# Patient Record
Sex: Male | Born: 1950 | Race: White | Hispanic: No | Marital: Married | Smoking: Never smoker
Health system: Southern US, Community
[De-identification: ages and names within clinical notes are randomized; demographics above are authoritative.]

## PROBLEM LIST (undated history)

## (undated) DIAGNOSIS — R55 Syncope and collapse: Secondary | ICD-10-CM

## (undated) DIAGNOSIS — Z8639 Personal history of other endocrine, nutritional and metabolic disease: Secondary | ICD-10-CM

## (undated) DIAGNOSIS — N200 Calculus of kidney: Secondary | ICD-10-CM

## (undated) DIAGNOSIS — I10 Essential (primary) hypertension: Secondary | ICD-10-CM

## (undated) HISTORY — DX: Essential (primary) hypertension: I10

## (undated) HISTORY — DX: Syncope and collapse: R55

## (undated) HISTORY — PX: LITHOTRIPSY: SUR834

## (undated) HISTORY — DX: Personal history of other endocrine, nutritional and metabolic disease: Z86.39

---

## 2010-01-23 ENCOUNTER — Emergency Department (HOSPITAL_BASED_OUTPATIENT_CLINIC_OR_DEPARTMENT_OTHER): Admission: EM | Admit: 2010-01-23 | Discharge: 2010-01-23 | Payer: Self-pay | Admitting: Emergency Medicine

## 2010-01-23 ENCOUNTER — Ambulatory Visit: Payer: Self-pay | Admitting: Radiology

## 2013-08-01 ENCOUNTER — Ambulatory Visit (INDEPENDENT_AMBULATORY_CARE_PROVIDER_SITE_OTHER): Payer: BC Managed Care – PPO | Admitting: Cardiovascular Disease

## 2013-08-01 ENCOUNTER — Encounter: Payer: Self-pay | Admitting: Cardiovascular Disease

## 2013-08-01 VITALS — BP 150/82 | HR 66 | Ht 71.0 in | Wt 197.0 lb

## 2013-08-01 DIAGNOSIS — E785 Hyperlipidemia, unspecified: Secondary | ICD-10-CM

## 2013-08-01 DIAGNOSIS — Z9189 Other specified personal risk factors, not elsewhere classified: Secondary | ICD-10-CM

## 2013-08-01 DIAGNOSIS — Z87898 Personal history of other specified conditions: Secondary | ICD-10-CM | POA: Insufficient documentation

## 2013-08-01 DIAGNOSIS — I10 Essential (primary) hypertension: Secondary | ICD-10-CM

## 2013-08-01 DIAGNOSIS — R3911 Hesitancy of micturition: Secondary | ICD-10-CM

## 2013-08-01 DIAGNOSIS — Z79899 Other long term (current) drug therapy: Secondary | ICD-10-CM

## 2013-08-01 LAB — COMPLETE METABOLIC PANEL WITH GFR
AST: 25 U/L (ref 0–37)
Chloride: 104 mEq/L (ref 96–112)
GFR, Est African American: >89 mL/min
GFR, Est Non African American: 84 mL/min
Glucose, Bld: 86 mg/dL (ref 70–99)
Sodium: 139 mEq/L (ref 135–145)
Total Protein: 7.3 g/dL (ref 6.0–8.3)

## 2013-08-01 LAB — CBC
HCT: 43 % (ref 39.0–52.0)
Hemoglobin: 15 g/dL (ref 13.0–17.0)
Platelets: 219 10*3/uL (ref 150–400)
RDW: 13.5 % (ref 11.5–15.5)

## 2013-08-01 LAB — LIPID PANEL
HDL: 47 mg/dL (ref >39)
Triglycerides: 78 mg/dL (ref <150)
VLDL: 16 mg/dL (ref 0–40)

## 2013-08-01 LAB — TSH: TSH: 1.534 u[IU]/mL (ref 0.350–4.500)

## 2013-08-01 MED ORDER — NIACIN ER (ANTIHYPERLIPIDEMIC) 500 MG PO TBCR: 500.0000 mg | EXTENDED_RELEASE_TABLET | Freq: Every day | ORAL | Status: DC

## 2013-08-01 MED ORDER — SIMVASTATIN 40 MG PO TABS: 40.0000 mg | ORAL_TABLET | Freq: Every day | ORAL | Status: DC

## 2013-08-01 MED ORDER — NEBIVOLOL HCL 5 MG PO TABS: 5.0000 mg | ORAL_TABLET | Freq: Every day | ORAL | Status: DC

## 2013-08-01 NOTE — Assessment & Plan Note (Signed)
He had a positive tilt table test approximately 20 years ago and has been treated medically with no recurrence.

## 2013-08-01 NOTE — Progress Notes (Signed)
     08/01/2013 Dustin Meager Jr.   Aug 20, 1951  [id number]  Primary Physician Dustin Maker, MD Primary Cardiologist: Runell Gess MD Dustin Woodard   HPI:  Dustin Woodard is a very pleasant 62 year old married Caucasian male father of 3, gram-positive 3 children referred by Dr. Hoy Woodard to establish in our cardiovascular practice. His primary care physician is Dr. Gerlene Woodard or in [facility]. 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 History of cardiovascular psychopathology positive for hypertension and hyperlipidemia otherwise is benign. He drinks socially and does not smoke. He denies chest pain or shortness of breath.   No current outpatient prescriptions on file.   No current facility-administered medications for this visit.    No Known Allergies  History   Social History  . Marital Status: Married    Spouse Name: N/A    Number of Children: N/A  . Years of Education: N/A   Occupational History  . Not on file.   Social History Main Topics  . Smoking status: Never Smoker   . Smokeless tobacco: Not on file  . Alcohol Use: Yes     Comment: occ wine and beer  . Drug Use: Not on file  . Sexual Activity: Not on file   Other Topics Concern  . Not on file   Social History Narrative  . No narrative on file     Review of Systems: General: negative for chills, fever, night sweats or weight changes.  Cardiovascular: negative for chest pain, dyspnea on exertion, edema, orthopnea, palpitations, paroxysmal nocturnal dyspnea or shortness of breath Dermatological: negative for rash Respiratory: negative for cough or wheezing Urologic: negative for hematuria Abdominal: negative for nausea, vomiting, diarrhea, bright red blood per rectum, melena, or hematemesis Neurologic: negative for visual changes, syncope, or dizziness All other systems reviewed and are otherwise negative except as noted above.    Blood pressure 150/82,  pulse 66, height 5\' 11"  (1.803 m), weight 197 lb (89.359 kg).  General appearance: alert and no distress Neck: no adenopathy, no carotid bruit, no JVD, supple, symmetrical, trachea midline and thyroid not enlarged, symmetric, no tenderness/mass/nodules Lungs: clear to auscultation bilaterally Heart: regular rate and rhythm, S1, S2 normal, no murmur, click, rub or gallop Abdomen: soft, non-tender; bowel sounds normal; no masses,  no organomegaly Extremities: extremities normal, atraumatic, no cyanosis or edema Pulses: 2+ and symmetric  EKG normal sinus rhythm at 65 without ST or T wave changes  ASSESSMENT AND PLAN:   Essential hypertension Well-controlled on current medications  Hyperlipidemia On statin therapy followed by his PCP. We will recheck a lipid and liver profile  History of syncope He had a positive tilt table test approximately 20 years ago and has been treated medically with no recurrence.      Runell Gess MD FACP,FACC,FAHA, [facility] 08/01/2013 11:35 AM

## 2013-08-01 NOTE — Assessment & Plan Note (Signed)
On statin therapy followed by his PCP. We will recheck a lipid and liver profile 

## 2013-08-01 NOTE — Patient Instructions (Signed)
Your physician wants you to follow-up in: 1 year with Dr Allyson Sabal. You will receive a reminder letter in the mail two months in advance. If you don't receive a letter, please call our office to schedule the follow-up appointment.   Blood work to be done today.

## 2013-08-01 NOTE — Assessment & Plan Note (Signed)
Well-controlled on current medications 

## 2013-08-02 ENCOUNTER — Encounter: Payer: Self-pay | Admitting: Cardiovascular Disease

## 2013-08-02 LAB — PSA: PSA: 1.93 ng/mL (ref <=4.00)

## 2013-08-09 ENCOUNTER — Encounter: Payer: Self-pay | Admitting: *Deleted

## 2014-04-08 ENCOUNTER — Telehealth: Payer: Self-pay | Admitting: Cardiovascular Disease

## 2014-04-08 NOTE — Telephone Encounter (Signed)
Mr.Marcello is needing a lab order sent to him before his appointment on 08/12/14  Thanks

## 2014-04-08 NOTE — Telephone Encounter (Signed)
Spoke with pt, he will come fasting to his follow up appt and have labs drawn after being seen

## 2014-07-29 ENCOUNTER — Telehealth: Payer: Self-pay | Admitting: Cardiovascular Disease

## 2014-07-29 MED ORDER — SIMVASTATIN 40 MG PO TABS: 40.0000 mg | ORAL_TABLET | Freq: Every day | ORAL | Status: DC

## 2014-07-29 MED ORDER — NIACIN ER (ANTIHYPERLIPIDEMIC) 500 MG PO TBCR: 500.0000 mg | EXTENDED_RELEASE_TABLET | Freq: Every day | ORAL | Status: DC

## 2014-07-29 NOTE — Telephone Encounter (Signed)
Pt called in needing new prescriptions for his Niacin 500mg  and his Simvastatin 40mg  call in to the [facility] on [address] in [place]. Please call  Thanks

## 2014-07-29 NOTE — Telephone Encounter (Signed)
Rx was sent to pharmacy electronically. OV 12/4 

## 2014-08-02 ENCOUNTER — Encounter: Payer: Self-pay | Admitting: Cardiovascular Disease

## 2014-08-02 ENCOUNTER — Ambulatory Visit (INDEPENDENT_AMBULATORY_CARE_PROVIDER_SITE_OTHER): Payer: BC Managed Care – PPO | Admitting: Cardiovascular Disease

## 2014-08-02 VITALS — BP 126/80 | HR 59 | Ht 71.0 in | Wt 197.6 lb

## 2014-08-02 DIAGNOSIS — Z79899 Other long term (current) drug therapy: Secondary | ICD-10-CM

## 2014-08-02 DIAGNOSIS — E785 Hyperlipidemia, unspecified: Secondary | ICD-10-CM

## 2014-08-02 DIAGNOSIS — I10 Essential (primary) hypertension: Secondary | ICD-10-CM

## 2014-08-02 DIAGNOSIS — E782 Mixed hyperlipidemia: Secondary | ICD-10-CM

## 2014-08-02 LAB — LIPID PANEL
CHOLESTEROL: 156 mg/dL (ref 0–200)
HDL: 49 mg/dL (ref >39)
LDL Cholesterol: 90 mg/dL (ref 0–99)
TRIGLYCERIDES: 87 mg/dL (ref <150)
Total CHOL/HDL Ratio: 3.2 Ratio
VLDL: 17 mg/dL (ref 0–40)

## 2014-08-02 LAB — HEPATIC FUNCTION PANEL
ALT: 21 U/L (ref 0–53)
AST: 22 U/L (ref 0–37)
Albumin: 4.5 g/dL (ref 3.5–5.2)
Alkaline Phosphatase: 89 U/L (ref 39–117)
BILIRUBIN TOTAL: 0.8 mg/dL (ref 0.2–1.2)
Bilirubin, Direct: 0.2 mg/dL (ref 0.0–0.3)
Indirect Bilirubin: 0.6 mg/dL (ref 0.2–1.2)
Total Protein: 7 g/dL (ref 6.0–8.3)

## 2014-08-02 MED ORDER — NEBIVOLOL HCL 5 MG PO TABS: 5.0000 mg | ORAL_TABLET | Freq: Every day | ORAL | Status: DC

## 2014-08-02 NOTE — Patient Instructions (Signed)
Your physician wants you to follow-up in: 1 year. You will receive a reminder letter in the mail two months in advance. If you don't receive a letter, please call our office to schedule the follow-up appointment.  

## 2014-08-02 NOTE — Assessment & Plan Note (Signed)
History of hypertension with blood pressure measured today in the office in 150/82. He is on by systolic 5 mg a day. Continue current medications at current dosing

## 2014-08-02 NOTE — Assessment & Plan Note (Addendum)
History of hyperlipidemia on simvastatin 40 mg daily. We will check a lipid and liver profile

## 2014-08-02 NOTE — Progress Notes (Signed)
08/02/2014 Dustin MeagerJulian Adolph Jr.   [birth year]  [id number]  Primary Physician Dustin Woodard,Dustin L, MD Primary Cardiologist: Dustin GessJonathan J. Jane Broughton MD Dustin RenoFACP,FACC,FAHA, FSCAI   HPI:   [name] is a very pleasant 63 year old married Caucasian male father of 3, grandfather 3 grandchildren referred by Dr. Hoy Mornharlie Woodard to establish in our cardiovascular practice. His primary care physician is Dr. Royanne Footsichard Woodard in [place]. 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 History of cardiovascular psychopathology positive for hypertension and hyperlipidemia otherwise is benign. He drinks socially and does not smoke. He denies chest pain or shortness of breath. I last saw him one year ago. He denies chest pain or shortness of breath. He had brief episode of hematuria which is being worked up by a urologist. He did take a trip to [place] finishing since I saw him last.   Current Outpatient Prescriptions  Medication Sig Dispense Refill  . aspirin 81 MG tablet Take 81 mg by mouth daily.    . Multiple Vitamin (MULTIVITAMIN) capsule Take 1 capsule by mouth daily.    . nebivolol (BYSTOLIC) 5 MG tablet Take 1 tablet (5 mg total) by mouth daily. 90 tablet 3  . niacin (NIASPAN) 500 MG CR tablet Take 1 tablet (500 mg total) by mouth at bedtime. 90 tablet 0  . simvastatin (ZOCOR) 40 MG tablet Take 1 tablet (40 mg total) by mouth daily. 90 tablet 0   No current facility-administered medications for this visit.    No Known Allergies  History   Social History  . Marital Status: Married    Spouse Name: N/A    Number of Children: N/A  . Years of Education: N/A   Occupational History  . Not on file.   Social History Main Topics  . Smoking status: Never Smoker   . Smokeless tobacco: Not on file  . Alcohol Use: Yes     Comment: occ wine and beer  . Drug Use: Not on file  . Sexual Activity: Not on file   Other Topics Concern  . Not on file   Social History Narrative     Review of Systems: General: negative for chills, fever, night sweats or weight changes.  Cardiovascular: negative for chest pain, dyspnea on exertion, edema, orthopnea, palpitations, paroxysmal nocturnal dyspnea or shortness of breath Dermatological: negative for rash Respiratory: negative for cough or wheezing Urologic: negative for hematuria Abdominal: negative for nausea, vomiting, diarrhea, bright red blood per rectum, melena, or hematemesis Neurologic: negative for visual changes, syncope, or dizziness All other systems reviewed and are otherwise negative except as noted above.    Blood pressure 126/80, pulse 59, height 5\' 11"  (1.803 m), weight 197 lb 9.6 oz (89.631 kg).  General appearance: alert and no distress Neck: no adenopathy, no carotid bruit, no JVD, supple, symmetrical, trachea midline and thyroid not enlarged, symmetric, no tenderness/mass/nodules Lungs: clear to auscultation bilaterally Heart: regular rate and rhythm, S1, S2 normal, no murmur, click, rub or gallop Extremities: extremities normal, atraumatic, no cyanosis or edema  EKG sinus bradycardia of 59 with ST or T-wave changes. I personally reviewed this EKG  ASSESSMENT AND PLAN:   Essential hypertension History of hypertension with blood pressure measured today in the office in 150/82. He is on by systolic 5 mg a day. Continue current medications at current dosing  Hyperlipidemia History of hyperlipidemia on simvastatin 40 mg daily. We will check a lipid and liver profile      Dustin GessJonathan J. German Manke  MD FACP,FACC,FAHA, Franklyn LorFSCAI 08/02/2014 9:11 AM

## 2014-08-06 ENCOUNTER — Encounter: Payer: Self-pay | Admitting: *Deleted

## 2014-08-23 ENCOUNTER — Emergency Department (HOSPITAL_BASED_OUTPATIENT_CLINIC_OR_DEPARTMENT_OTHER): Payer: BC Managed Care – PPO

## 2014-08-23 ENCOUNTER — Emergency Department (HOSPITAL_BASED_OUTPATIENT_CLINIC_OR_DEPARTMENT_OTHER)
Admission: EM | Admit: 2014-08-23 | Discharge: 2014-08-23 | Disposition: A | Discharge status: Home / Self Care | Payer: BC Managed Care – PPO | Attending: Emergency Medicine | Admitting: Emergency Medicine

## 2014-08-23 ENCOUNTER — Encounter (HOSPITAL_BASED_OUTPATIENT_CLINIC_OR_DEPARTMENT_OTHER): Payer: Self-pay | Admitting: *Deleted

## 2014-08-23 DIAGNOSIS — Z79899 Other long term (current) drug therapy: Secondary | ICD-10-CM | POA: Diagnosis not present

## 2014-08-23 DIAGNOSIS — Z7982 Long term (current) use of aspirin: Secondary | ICD-10-CM | POA: Diagnosis not present

## 2014-08-23 DIAGNOSIS — I10 Essential (primary) hypertension: Secondary | ICD-10-CM | POA: Insufficient documentation

## 2014-08-23 DIAGNOSIS — Z9889 Other specified postprocedural states: Secondary | ICD-10-CM | POA: Insufficient documentation

## 2014-08-23 DIAGNOSIS — R1030 Lower abdominal pain, unspecified: Secondary | ICD-10-CM | POA: Diagnosis present

## 2014-08-23 DIAGNOSIS — R109 Unspecified abdominal pain: Secondary | ICD-10-CM

## 2014-08-23 DIAGNOSIS — E785 Hyperlipidemia, unspecified: Secondary | ICD-10-CM | POA: Insufficient documentation

## 2014-08-23 DIAGNOSIS — N201 Calculus of ureter: Secondary | ICD-10-CM | POA: Diagnosis not present

## 2014-08-23 HISTORY — DX: Calculus of kidney: N20.0

## 2014-08-23 LAB — URINE MICROSCOPIC-ADD ON

## 2014-08-23 LAB — CBC
HEMATOCRIT: 38.6 % — AB (ref 39.0–52.0)
Hemoglobin: 13.4 g/dL (ref 13.0–17.0)
MCH: 32.3 pg (ref 26.0–34.0)
MCHC: 34.7 g/dL (ref 30.0–36.0)
MCV: 93 fL (ref 78.0–100.0)
Platelets: 200 10*3/uL (ref 150–400)
RBC: 4.15 MIL/uL — ABNORMAL LOW (ref 4.22–5.81)
RDW: 11.7 % (ref 11.5–15.5)
WBC: 8.8 10*3/uL (ref 4.0–10.5)

## 2014-08-23 LAB — BASIC METABOLIC PANEL
Anion gap: 14 (ref 5–15)
BUN: 23 mg/dL (ref 6–23)
CHLORIDE: 99 meq/L (ref 96–112)
CO2: 24 mmol/L (ref 19–32)
Calcium: 9.7 mg/dL (ref 8.4–10.5)
Creatinine, Ser: 1.59 mg/dL — ABNORMAL HIGH (ref 0.50–1.35)
GFR, EST AFRICAN AMERICAN: 52 mL/min — AB (ref >90)
GFR, EST NON AFRICAN AMERICAN: 45 mL/min — AB (ref >90)
GLUCOSE: 106 mg/dL — AB (ref 70–99)
POTASSIUM: 4.2 mmol/L (ref 3.5–5.1)
Sodium: 137 mmol/L (ref 135–145)

## 2014-08-23 LAB — URINALYSIS, ROUTINE W REFLEX MICROSCOPIC
Bilirubin Urine: NEGATIVE
GLUCOSE, UA: NEGATIVE mg/dL
KETONES UR: 40 mg/dL — AB
Nitrite: NEGATIVE
PROTEIN: 30 mg/dL — AB
Specific Gravity, Urine: 1.023 (ref 1.005–1.030)
Urobilinogen, UA: 0.2 mg/dL (ref 0.0–1.0)
pH: 5.5 (ref 5.0–8.0)

## 2014-08-23 MED ORDER — FENTANYL CITRATE 0.05 MG/ML IJ SOLN
INTRAMUSCULAR | Status: AC
  Filled 2014-08-23: qty 2

## 2014-08-23 MED ORDER — FENTANYL CITRATE 0.05 MG/ML IJ SOLN
50.0000 ug | Freq: Once | INTRAMUSCULAR | Status: AC
  Administered 2014-08-23: 50 ug via INTRAVENOUS

## 2014-08-23 NOTE — Discharge Instructions (Signed)
Return to the ED with any concerns including fever/chills, vomiting, worsening pain that is not controlled by pain mediciations, decreased level of alertness/lethargy, or any other alarming symptoms

## 2014-08-23 NOTE — ED Notes (Signed)
MD at bedside. 

## 2014-08-23 NOTE — ED Provider Notes (Signed)
CSN: [id number]     Arrival date & time 08/23/14  1544 History   First MD Initiated Contact with Patient 08/23/14 1604     Chief Complaint  Patient presents with  . Flank Pain     (Consider location/radiation/quality/duration/timing/severity/associated sxs/prior Treatment) HPI  Pt presenting with c/o left flank pain.  Pt states that he had lithotripsy earlier in the week for large left kidney stone- since that time he has been passing small pieces of stone.  He has been taking oxycodone which has been mostly helping to relieve his pain. Today pain became worse.  No vomiting but has felt nauseated.  No  Fever/chills.  Has been seeing some intermittent blood in his urine as well, but no difficulty urinating.  There are no other associated systemic symptoms, there are no other alleviating or modifying factors.   Past Medical History  Diagnosis Date  . HTN (hypertension)   . H/O hyperlipidemia   . Syncope 1990s    positive tilt table 20 years ago  . Kidney stone    Past Surgical History  Procedure Laterality Date  . Lithotripsy     Family History  Problem Relation Age of Onset  . Heart disease Mother     diagnosed in her 9360's   History  Substance Use Topics  . Smoking status: Never Smoker   . Smokeless tobacco: Not on file  . Alcohol Use: Yes     Comment: occ wine and beer    Review of Systems  ROS reviewed and all otherwise negative except for mentioned in HPI    Allergies  Review of patient's allergies indicates no known allergies.  Home Medications   Prior to Admission medications   Medication Sig Start Date End Date Taking? Authorizing Provider  oxyCODONE-acetaminophen (PERCOCET/ROXICET) 5-325 MG per tablet Take by mouth every 4 (four) hours as needed for severe pain.   Yes Historical Provider, MD  tamsulosin (FLOMAX) 0.4 MG CAPS capsule Take 0.4 mg by mouth.   Yes Historical Provider, MD  aspirin 81 MG tablet Take 81 mg by mouth daily.    Historical Provider, MD   Multiple Vitamin (MULTIVITAMIN) capsule Take 1 capsule by mouth daily.    Historical Provider, MD  nebivolol (BYSTOLIC) 5 MG tablet Take 1 tablet (5 mg total) by mouth daily. 08/02/14   Runell GessJonathan J Berry, MD  niacin (NIASPAN) 500 MG CR tablet Take 1 tablet (500 mg total) by mouth at bedtime. 07/29/14   Runell GessJonathan J Berry, MD  simvastatin (ZOCOR) 40 MG tablet Take 1 tablet (40 mg total) by mouth daily. 07/29/14   Runell GessJonathan J Berry, MD   BP 156/78 mmHg  Pulse 82  Temp(Src) 98.6 F (37 C) (Oral)  Resp 16  Ht 5\' 11"  (1.803 m)  Wt 190 lb (86.183 kg)  BMI 26.51 kg/m2  SpO2 97%  Vitals reviewed Physical Exam  Physical Examination: General appearance - alert, well appearing, and in no distress Mental status - alert, oriented to person, place, and time Eyes - no conjunctival injection, no scleral icterus Chest - clear to auscultation, no wheezes, rales or rhonchi, symmetric air entry Heart - normal rate, regular rhythm, normal S1, S2, no murmurs, rubs, clicks or gallops Abdomen - soft, nontender, nondistended, no masses or organomegaly Back exam - full range of motion, no midline tenderness to palpation, left CVA tenderness Extremities - peripheral pulses normal, no pedal edema, no clubbing or cyanosis Skin - normal coloration and turgor, no rashes  ED Course  Procedures (including  critical care time)  5:41 PM pt feeling much improved, states his pain is resolved, labs reveal mild renal insufficiency, 3mm UVJ stone that is almost into the bladder.  Labs Review Labs Reviewed  URINALYSIS, ROUTINE W REFLEX MICROSCOPIC - Abnormal; Notable for the following:    Color, Urine AMBER (*)    APPearance CLOUDY (*)    Hgb urine dipstick LARGE (*)    Ketones, ur 40 (*)    Protein, ur 30 (*)    Leukocytes, UA SMALL (*)    All other components within normal limits  CBC - Abnormal; Notable for the following:    RBC 4.15 (*)    HCT 38.6 (*)    All other components within normal limits  BASIC METABOLIC  PANEL - Abnormal; Notable for the following:    Glucose, Bld 106 (*)    Creatinine, Ser 1.59 (*)    GFR calc non Af Amer 45 (*)    GFR calc Af Amer 52 (*)    All other components within normal limits  URINE MICROSCOPIC-ADD ON - Abnormal; Notable for the following:    Squamous Epithelial / LPF FEW (*)    Bacteria, UA FEW (*)    All other components within normal limits  URINE CULTURE    Imaging Review No results found.   EKG Interpretation None      MDM   Final diagnoses:  Flank pain  Left ureteral stone    Pt presenting with c/o left flank pain after lithotripsy earlier this week- has been passing small stones- CT scan shows 3mm uvj stone that is almost to bladder today. Pt feels much improved after IV pain meds in the ED.  Creat mildly elevated, but no obstucting stone at this time- pt will f/u with urology, urine culture sent as well.  Discharged with strict return precautions.  Pt agreeable with plan.    Ethelda ChickMartha K Linker, MD 08/25/14 [phone]

## 2014-08-23 NOTE — ED Notes (Signed)
Had lithotripsy Monday for left side kidney stone - pt report increased pain in kidney

## 2014-08-24 LAB — URINE CULTURE
Colony Count: NO GROWTH
Culture: NO GROWTH

## 2014-10-20 ENCOUNTER — Other Ambulatory Visit: Payer: Self-pay | Admitting: Cardiovascular Disease

## 2014-10-21 NOTE — Telephone Encounter (Signed)
Rx(s) sent to pharmacy electronically.  

## 2015-08-12 ENCOUNTER — Ambulatory Visit (INDEPENDENT_AMBULATORY_CARE_PROVIDER_SITE_OTHER): Payer: BC Managed Care – PPO | Admitting: Cardiovascular Disease

## 2015-08-12 ENCOUNTER — Encounter: Payer: Self-pay | Admitting: Cardiovascular Disease

## 2015-08-12 VITALS — BP 122/70 | HR 59 | Ht 71.0 in | Wt 176.0 lb

## 2015-08-12 DIAGNOSIS — I10 Essential (primary) hypertension: Secondary | ICD-10-CM

## 2015-08-12 DIAGNOSIS — E785 Hyperlipidemia, unspecified: Secondary | ICD-10-CM

## 2015-08-12 LAB — HEPATIC FUNCTION PANEL
ALBUMIN: 4.3 g/dL (ref 3.6–5.1)
ALT: 19 U/L (ref 9–46)
AST: 19 U/L (ref 10–35)
Alkaline Phosphatase: 99 U/L (ref 40–115)
BILIRUBIN TOTAL: 0.8 mg/dL (ref 0.2–1.2)
Bilirubin, Direct: 0.2 mg/dL (ref <=0.2)
Indirect Bilirubin: 0.6 mg/dL (ref 0.2–1.2)
Total Protein: 6.6 g/dL (ref 6.1–8.1)

## 2015-08-12 LAB — LIPID PANEL
CHOLESTEROL: 148 mg/dL (ref 125–200)
HDL: 60 mg/dL (ref >=40)
LDL Cholesterol: 78 mg/dL (ref <130)
TRIGLYCERIDES: 52 mg/dL (ref <150)
Total CHOL/HDL Ratio: 2.5 Ratio (ref <=5.0)
VLDL: 10 mg/dL (ref <30)

## 2015-08-12 MED ORDER — NEBIVOLOL HCL 5 MG PO TABS: 5.0000 mg | ORAL_TABLET | Freq: Every day | ORAL | Status: DC

## 2015-08-12 MED ORDER — NIACIN ER (ANTIHYPERLIPIDEMIC) 500 MG PO TBCR
500.0000 mg | EXTENDED_RELEASE_TABLET | Freq: Every day | ORAL | Status: DC
Start: 2015-08-12 — End: 2016-09-10

## 2015-08-12 MED ORDER — SIMVASTATIN 40 MG PO TABS
40.0000 mg | ORAL_TABLET | Freq: Every day | ORAL | Status: DC
Start: 2015-08-12 — End: 2016-08-17

## 2015-08-12 NOTE — Patient Instructions (Signed)
Medication Instructions:  Your physician recommends that you continue on your current medications as directed. Please refer to the Current Medication list given to you today.   Labwork: Your physician recommends that you return for lab work in: FASTING (lipid/liver) The lab can be found on the FIRST FLOOR of out building in [address]   Testing/Procedures: none  Follow-Up: Your physician wants you to follow-up in: 12 months with Dr. Berry. You will receive a reminder letter in the mail two months in advance. If you don't receive a letter, please call our office to schedule the follow-up appointment.   Any Other Special Instructions Will Be Listed Below (If Applicable).     If you need a refill on your cardiac medications before your next appointment, please call your pharmacy.   

## 2015-08-12 NOTE — Progress Notes (Signed)
08/12/2015 Dustin MeagerJulian Gilardi Jr.   1950/12/29  [id number]  Primary Physician Dustin MakerR,Dustin Woodard Primary Cardiologist: Dustin Woodard Dustin RenoFACP,FACC,FAHA, FSCAI   HPI:  [name] is a very pleasant 64 year old married Caucasian male father of 3, grandfather 3 grandchildren referred by Dr. Hoy Mornharlie Woodard to establish in our cardiovascular practice. His primary care physician is Dr. Royanne Foot[place]ichard [place]ard [place] [place]. 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 I last saw him in the office 08/02/14. History of cardiovascular psychopathology positive for hypertension and hyperlipidemia otherwise is benign. He drinks socially and does not smoke. He denies chest pain or shortness of breath. I last saw him one year ago. He denies chest pain or shortness of breath. He is an active Pension scheme managerfisherman hunter and has taken that she tripped to [place] well as the [place] and [place]  Current Outpatient Prescriptions  Medication Sig Dispense Refill  . aspirin 81 MG tablet Take 81 mg by mouth daily.    . nebivolol (BYSTOLIC) 5 MG tablet Take 1 tablet (5 mg total) by mouth daily. 90 tablet 3  . niacin (NIASPAN) 500 MG CR tablet Take 1 tablet (500 mg total) by mouth at bedtime. 90 tablet 3  . simvastatin (ZOCOR) 40 MG tablet Take 1 tablet (40 mg total) by mouth daily. 90 tablet 3   No current facility-administered medications for this visit.    No Known Allergies  Social History   Social History  . Marital Status: Married    Spouse Name: N/A  . Number of Children: N/A  . Years of Education: N/A   Occupational History  . Not on file.   Social History Main Topics  . Smoking status: Never Smoker   . Smokeless tobacco: Not on file  . Alcohol Use: Yes     Comment: occ wine and beer  . Drug Use: Not on file  . Sexual Activity: Not on file   Other Topics Concern  . Not on file   Social History Narrative     Review of Systems: General: negative for  chills, fever, night sweats or weight changes.  Cardiovascular: negative for chest pain, dyspnea on exertion, edema, orthopnea, palpitations, paroxysmal nocturnal dyspnea or shortness of breath Dermatological: negative for rash Respiratory: negative for cough or wheezing Urologic: negative for hematuria Abdominal: negative for nausea, vomiting, diarrhea, bright red blood per rectum, melena, or hematemesis Neurologic: negative for visual changes, syncope, or dizziness All other systems reviewed and are otherwise negative except as noted above.    Blood pressure 122/70, pulse 59, height 5\' 11"  (1.803 m), weight 176 lb (79.833 kg).  General appearance: alert and no distress Neck: no adenopathy, no carotid bruit, no JVD, supple, symmetrical, trachea midline and thyroid not enlarged, symmetric, no tenderness/mass/nodules Lungs: clear to auscultation bilaterally Heart: regular rate and rhythm, S1, S2 normal, no murmur, click, rub or gallop Extremities: extremities normal, atraumatic, no cyanosis or edema  EKG sinus bradycardia 59 without ST or T-wave changes. I personally reviewed this EKG  ASSESSMENT AND PLAN:   Hyperlipidemia History of hyperlipidemia on simvastatin 40 mg a day. We will recheck a lipid and liver profile  Essential hypertension History of hypertension with blood pressure measured today at 122/70. He is on Bystolic . He's lost 20 pounds since I last saw him as result of a diet. Continue current meds at current dosing      Dustin GessJonathan J. Harveen Flesch Woodard [facility] Surgery Center IncFACP,FACC,FAHA, [facility] LLCFSCAI 08/12/2015 8:56 AM

## 2015-08-12 NOTE — Assessment & Plan Note (Signed)
History of hypertension with blood pressure measured today at 122/70. He is on Bystolic . He's lost 20 pounds since I last saw him as result of a diet. Continue current meds at current dosing

## 2015-08-12 NOTE — Assessment & Plan Note (Signed)
History of hyperlipidemia on simvastatin 40 mg a day. We will recheck a lipid and liver profile 

## 2016-02-14 IMAGING — CT CT RENAL STONE PROTOCOL
2 of 4 series · 15 of 46 positions shown, 17 images · non-contrast
Comparison: 05/18/2011

CLINICAL DATA: Acute left flank pain, following lithotripsy on
[REDACTED].

EXAM:
CT ABDOMEN AND PELVIS WITHOUT CONTRAST
TECHNIQUE: Multidetector CT imaging of the abdomen and pelvis was performed
following the standard protocol without IV contrast.

[Series 2: renal stone > 200 lbs 5.0 b31f · axial · 0.76mm/px · z∈[+879,+1334]mm · 12 of 101 slices shown, 14 images]
[im 5/101  soft-tissue]
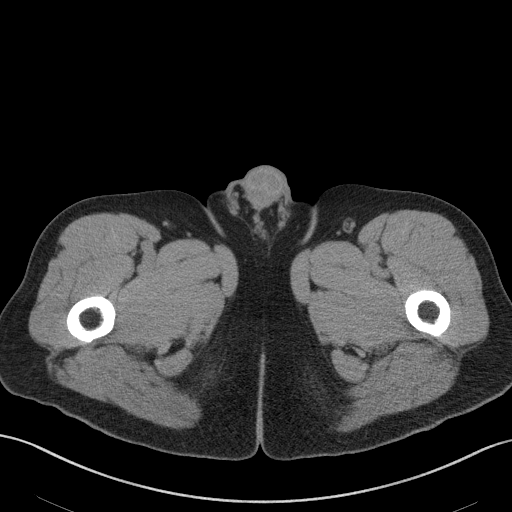
[im 5/101  bone]
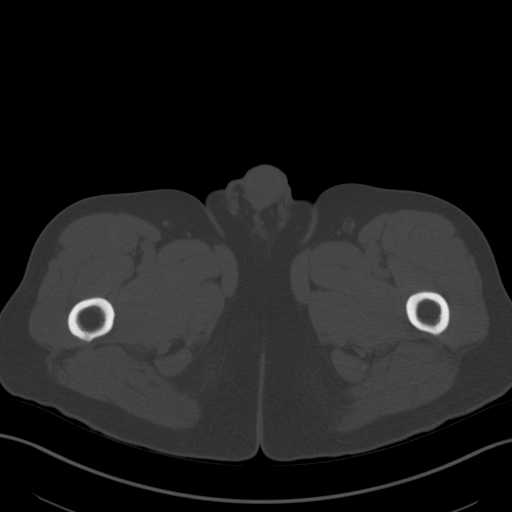
[im 13/101  soft-tissue]
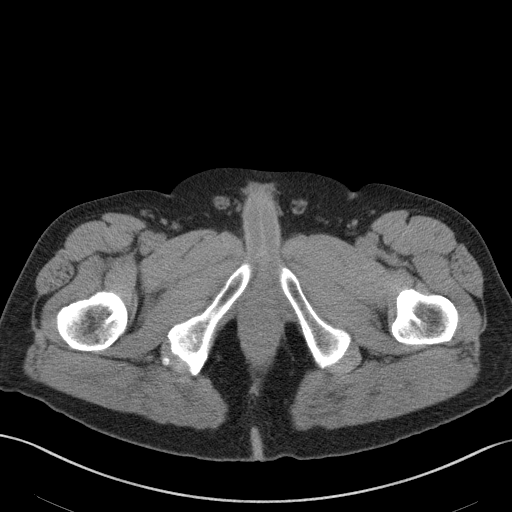
[im 21/101  soft-tissue]
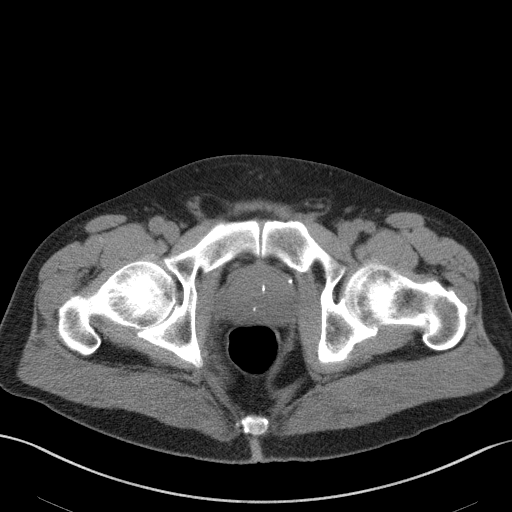
[im 30/101  soft-tissue]
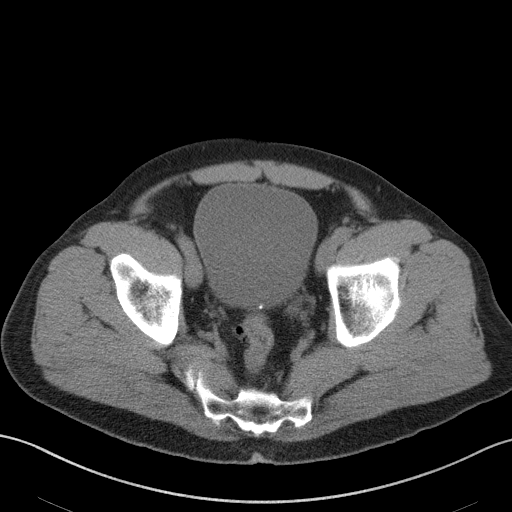
[im 38/101  soft-tissue]
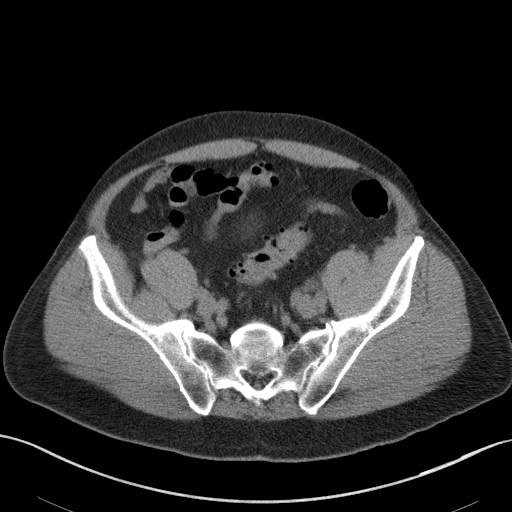
[im 46/101  soft-tissue]
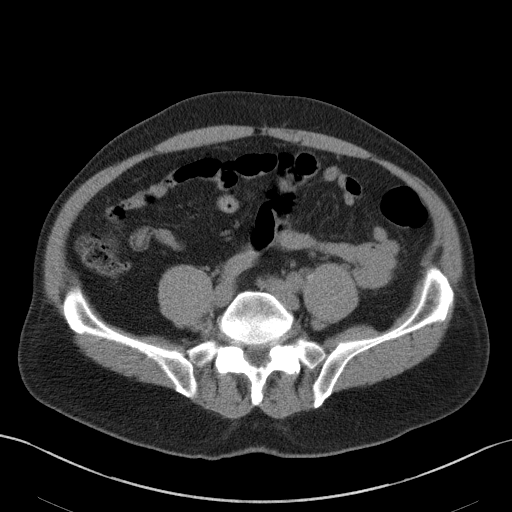
[im 55/101  soft-tissue]
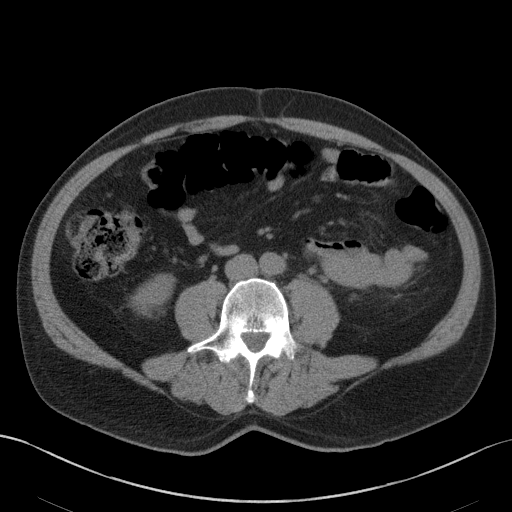
[im 63/101  soft-tissue]
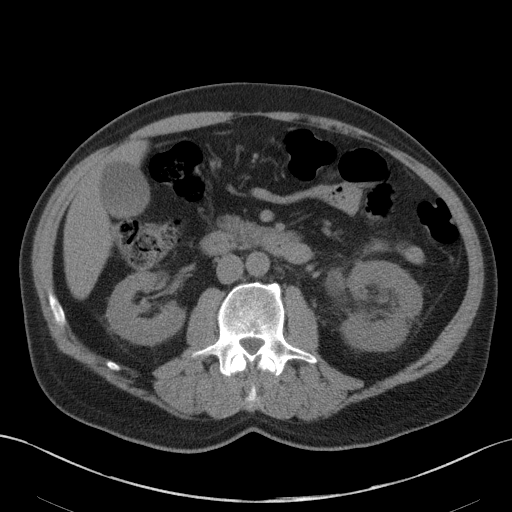
[im 71/101  soft-tissue]
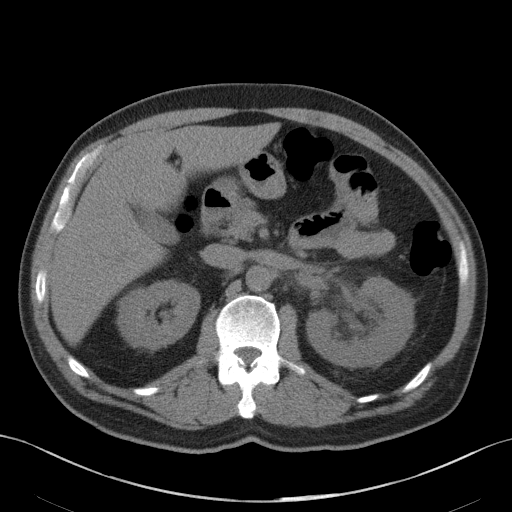
[im 71/101  bone]
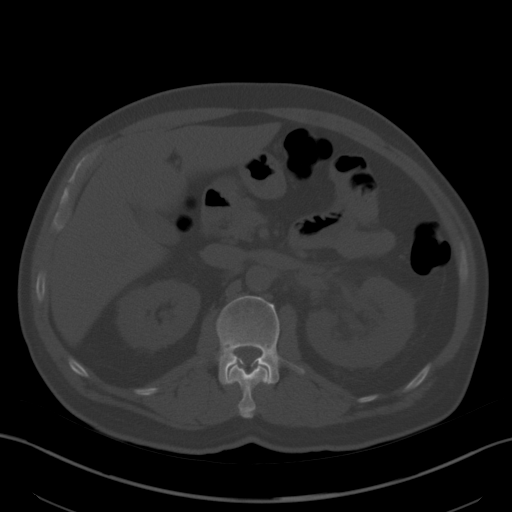
[im 80/101  soft-tissue]
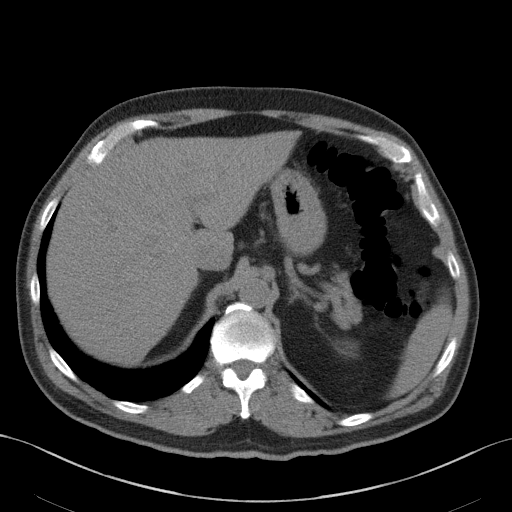
[im 88/101  soft-tissue]
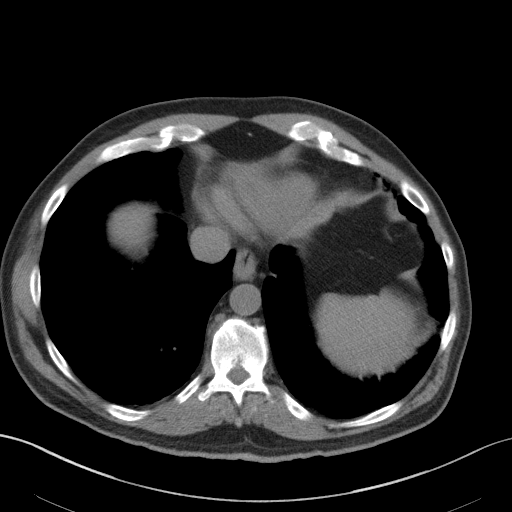
[im 96/101  soft-tissue]
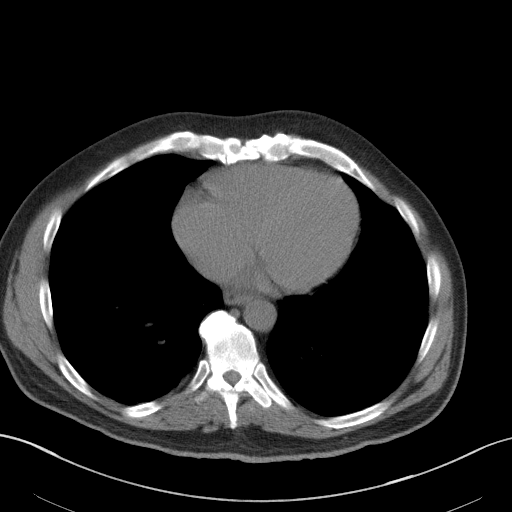

[Series 5: renal stone 3.0 coronal · coronal · 0.80mm/px · 3 of 97 slices shown]
[im 33/97  soft-tissue]
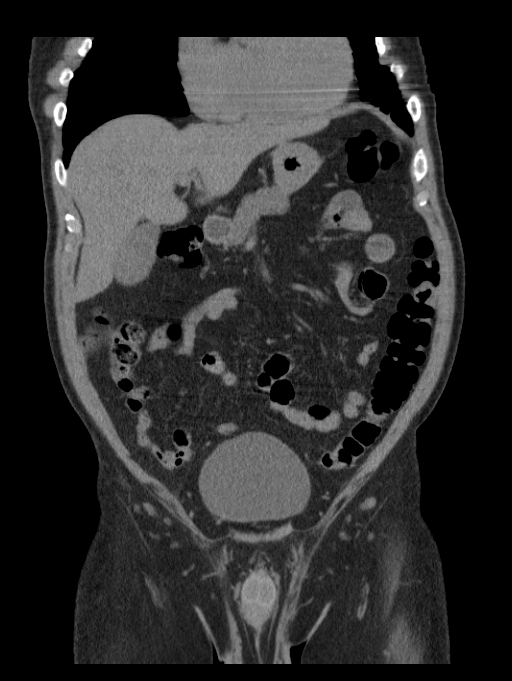
[im 43/97  soft-tissue]
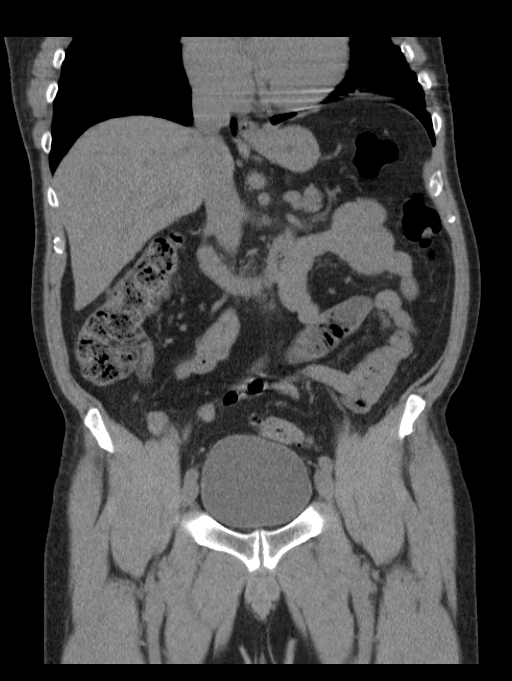
[im 54/97  soft-tissue]
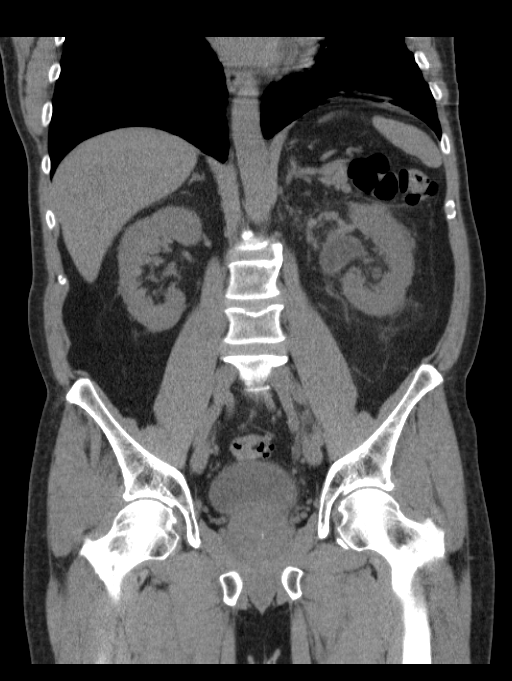

[15 of 46 positions shown; findings below may reference images not displayed]

FINDINGS: Lower chest: Minor dependent basilar atelectasis. No lower lobe
pneumonia, collapse or consolidation. Normal heart size. No
pericardial or pleural effusion. Small hiatal hernia noted.
Degenerative changes of the lower thoracic spine.

Abdomen: Left kidney demonstrates perinephric stranding edema/
inflammation with associated mild hydronephrosis. There is diffuse
left hydroureter in the abdomen extending into the pelvis where
there is a left UVJ calculus measuring 3 mm, very close to passing
into the bladder. Other punctate tiny layering calculi within the
bladder noted, image 73. No additional intrarenal calculi in the
left kidney.

Right kidney and ureter demonstrate no acute hydronephrosis,
obstruction, or ureteral calculus.

Liver, gallbladder, biliary system, pancreas, spleen, and adrenal
glands are within normal limits for age and noncontrast imaging.

Negative for bowel obstruction, dilatation, ileus, or free air.

No abdominal free fluid, fluid collection, hemorrhage, abscess, or
adenopathy.

Aortic atherosclerosis.  Negative for aneurysm.

Normal appendix demonstrated.

Pelvis: Sigmoid diverticulosis noted. 3 mm left distal UVJ calculus
again noted, about to pass into the bladder. Other tiny calculi
noted within the bladder already.

Additional pelvic calcifications are related to the prostate and
venous phleboliths.

No pelvic free fluid, fluid collection, hemorrhage, abscess,
adenopathy, inguinal abnormality, or hernia.

Diffuse degenerative changes of the spine and SI joints.
IMPRESSION: 3 mm left distal UVJ calculus with mild left hydroureteronephrosis.
This stone is very close to passing into the bladder if not already.

Additional punctate tiny bladder calculi layering dependently.

Small hiatal hernia

Sigmoid diverticulosis

## 2016-08-10 ENCOUNTER — Telehealth: Payer: Self-pay | Admitting: Cardiovascular Disease

## 2016-08-10 DIAGNOSIS — Z79899 Other long term (current) drug therapy: Secondary | ICD-10-CM

## 2016-08-10 DIAGNOSIS — E785 Hyperlipidemia, unspecified: Secondary | ICD-10-CM

## 2016-08-10 NOTE — Telephone Encounter (Signed)
That is fine with me.

## 2016-08-10 NOTE — Telephone Encounter (Signed)
Pt has appt 09-08-16 do you want labs before appt or to discuss and have day of appt?

## 2016-08-10 NOTE — Telephone Encounter (Signed)
What would you like to order? Lipid,hepatic and BMET?

## 2016-08-10 NOTE — Telephone Encounter (Signed)
New Message  Pt voice he's needing blood sample and cholesterol check on same day as appt with MD-Berry.  There's no order in system.  Please f/u

## 2016-08-11 DIAGNOSIS — Z79899 Other long term (current) drug therapy: Secondary | ICD-10-CM | POA: Insufficient documentation

## 2016-08-11 NOTE — Telephone Encounter (Signed)
Probably just a lipid and liver profile

## 2016-08-11 NOTE — Telephone Encounter (Signed)
Labs ordered  Left detailed message to come in as requested for lab at least 3 days before appt

## 2016-08-17 ENCOUNTER — Other Ambulatory Visit: Payer: Self-pay | Admitting: *Deleted

## 2016-08-17 DIAGNOSIS — I10 Essential (primary) hypertension: Secondary | ICD-10-CM

## 2016-08-17 DIAGNOSIS — E785 Hyperlipidemia, unspecified: Secondary | ICD-10-CM

## 2016-08-17 MED ORDER — NEBIVOLOL HCL 5 MG PO TABS: 5.0000 mg | ORAL_TABLET | Freq: Every day | ORAL | 0 refills | Status: DC

## 2016-08-17 MED ORDER — SIMVASTATIN 40 MG PO TABS: 40.0000 mg | ORAL_TABLET | Freq: Every day | ORAL | 0 refills | Status: DC

## 2016-09-07 ENCOUNTER — Other Ambulatory Visit: Payer: Self-pay | Admitting: Cardiovascular Disease

## 2016-09-07 ENCOUNTER — Encounter: Payer: Self-pay | Admitting: Cardiovascular Disease

## 2016-09-07 LAB — HEPATIC FUNCTION PANEL
ALBUMIN: 4.1 g/dL (ref 3.6–5.1)
ALT: 17 U/L (ref 9–46)
AST: 18 U/L (ref 10–35)
Alkaline Phosphatase: 89 U/L (ref 40–115)
BILIRUBIN TOTAL: 1 mg/dL (ref 0.2–1.2)
Bilirubin, Direct: 0.2 mg/dL (ref <=0.2)
Indirect Bilirubin: 0.8 mg/dL (ref 0.2–1.2)
Total Protein: 6.8 g/dL (ref 6.1–8.1)

## 2016-09-07 LAB — LIPID PANEL
CHOL/HDL RATIO: 2.6 ratio (ref <5.0)
Cholesterol: 150 mg/dL (ref <200)
HDL: 58 mg/dL (ref >40)
LDL CALC: 80 mg/dL (ref <100)
Triglycerides: 60 mg/dL (ref <150)
VLDL: 12 mg/dL (ref <30)

## 2016-09-08 ENCOUNTER — Ambulatory Visit: Payer: BC Managed Care – PPO | Admitting: Cardiovascular Disease

## 2016-09-10 ENCOUNTER — Ambulatory Visit (INDEPENDENT_AMBULATORY_CARE_PROVIDER_SITE_OTHER): Payer: BC Managed Care – PPO | Admitting: Cardiovascular Disease

## 2016-09-10 ENCOUNTER — Encounter: Payer: Self-pay | Admitting: Cardiovascular Disease

## 2016-09-10 VITALS — BP 153/89 | HR 56 | Ht 71.0 in | Wt 188.8 lb

## 2016-09-10 DIAGNOSIS — I10 Essential (primary) hypertension: Secondary | ICD-10-CM | POA: Diagnosis not present

## 2016-09-10 DIAGNOSIS — E785 Hyperlipidemia, unspecified: Secondary | ICD-10-CM

## 2016-09-10 DIAGNOSIS — E7800 Pure hypercholesterolemia, unspecified: Secondary | ICD-10-CM | POA: Diagnosis not present

## 2016-09-10 MED ORDER — NEBIVOLOL HCL 5 MG PO TABS: 5.0000 mg | ORAL_TABLET | Freq: Every day | ORAL | 0 refills | Status: DC

## 2016-09-10 MED ORDER — NIACIN ER (ANTIHYPERLIPIDEMIC) 500 MG PO TBCR: 500.0000 mg | EXTENDED_RELEASE_TABLET | Freq: Every day | ORAL | 3 refills | Status: DC

## 2016-09-10 MED ORDER — SIMVASTATIN 40 MG PO TABS: 40.0000 mg | ORAL_TABLET | Freq: Every day | ORAL | 0 refills | Status: DC

## 2016-09-10 NOTE — Assessment & Plan Note (Addendum)
History of hyperlipidemia on statin therapy with recent lipid profile performed 09/07/16 revealing total cholesterol 150, LDL 80 and HDL of 58

## 2016-09-10 NOTE — Patient Instructions (Signed)

## 2016-09-10 NOTE — Progress Notes (Signed)
09/10/2016 Dustin Meager Jr.   October 17, 1950  [id number]  Primary Physician Dan Maker, MD Primary Cardiologist: Runell Gess MD Roseanne Reno  HPI:  Mr. Biermann is a very pleasant 66 year old married Caucasian male father of 3, grandfather 3 grandchildren referred by Dr. Hoy Morn to establish in our cardiovascular practice. His primary care physician is Dr. Royanne Foots in [facility]. 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 I last saw him in the office 08/12/15. History of cardiovascular psychopathology positive for hypertension and hyperlipidemia otherwise is benign. He drinks socially and does not smoke. He denies chest pain or shortness of breath. I last saw him one year ago. He denies chest pain or shortness of breath. 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 [place] the near future as well as history.   Current Outpatient Prescriptions  Medication Sig Dispense Refill  . aspirin 81 MG tablet Take 81 mg by mouth daily.    . nebivolol (BYSTOLIC) 5 MG tablet Take 1 tablet (5 mg total) by mouth daily. 30 tablet 0  . niacin (NIASPAN) 500 MG CR tablet Take 1 tablet (500 mg total) by mouth at bedtime. 90 tablet 3  . simvastatin (ZOCOR) 40 MG tablet Take 1 tablet (40 mg total) by mouth daily. 30 tablet 0   No current facility-administered medications for this visit.     No Known Allergies  Social History   Social History  . Marital status: Married    Spouse name: N/A  . Number of children: N/A  . Years of education: N/A   Occupational History  . Not on file.   Social History Main Topics  . Smoking status: Never Smoker  . Smokeless tobacco: Not on file  . Alcohol use Yes     Comment: occ wine and beer  . Drug use: Unknown  . Sexual activity: Not on file   Other Topics Concern  . Not on file    Social History Narrative  . No narrative on file     Review of Systems: General: negative for chills, fever, night sweats or weight changes.  Cardiovascular: negative for chest pain, dyspnea on exertion, edema, orthopnea, palpitations, paroxysmal nocturnal dyspnea or shortness of breath Dermatological: negative for rash Respiratory: negative for cough or wheezing Urologic: negative for hematuria Abdominal: negative for nausea, vomiting, diarrhea, bright red blood per rectum, melena, or hematemesis Neurologic: negative for visual changes, syncope, or dizziness All other systems reviewed and are otherwise negative except as noted above.    Blood pressure (!) 153/89, pulse (!) 56, height 5\' 11"  (1.803 m), weight 188 lb 12.8 oz (85.6 kg).  General appearance: alert and no distress Neck: no adenopathy, no carotid bruit, no JVD, supple, symmetrical, trachea midline and thyroid not enlarged, symmetric, no tenderness/mass/nodules Lungs: clear to auscultation bilaterally Heart: regular rate and rhythm, S1, S2 normal, no murmur, click, rub or gallop Extremities: extremities normal, atraumatic, no cyanosis or edema  EKG sinus bradycardia 56 without ST or T-wave changes. I personally reviewed this EKG  ASSESSMENT AND PLAN:   Essential hypertension History of hypertension with blood pressure measured today at 153/89. He is on Bystolic . Continue current meds at current dosing  Hyperlipidemia History of hyperlipidemia on statin therapy with recent lipid profile performed 09/07/16 revealing total cholesterol 150,  LDL 80 and HDL of 58      Runell GessJonathan J. Berry MD [facility]FACP,FACC,FAHA, [facility] 09/10/2016 8:59 AM

## 2016-09-10 NOTE — Assessment & Plan Note (Signed)
History of hypertension with blood pressure measured today at 153/89. He is on Bystolic . Continue current meds at current dosing

## 2016-09-21 ENCOUNTER — Other Ambulatory Visit: Payer: Self-pay | Admitting: *Deleted

## 2016-09-21 DIAGNOSIS — I10 Essential (primary) hypertension: Secondary | ICD-10-CM

## 2016-09-21 DIAGNOSIS — E785 Hyperlipidemia, unspecified: Secondary | ICD-10-CM

## 2016-09-21 MED ORDER — SIMVASTATIN 40 MG PO TABS: 40.0000 mg | ORAL_TABLET | Freq: Every day | ORAL | 3 refills | Status: DC

## 2016-09-21 MED ORDER — NEBIVOLOL HCL 5 MG PO TABS: 5.0000 mg | ORAL_TABLET | Freq: Every day | ORAL | 3 refills | Status: DC

## 2017-09-07 ENCOUNTER — Telehealth: Payer: Self-pay | Admitting: Cardiovascular Disease

## 2017-09-07 NOTE — Telephone Encounter (Signed)
Patient calling to see if he needs bloodwork prior to appointment. Patient states that he has lab work at every annual f/u appt

## 2017-09-07 NOTE — Telephone Encounter (Signed)
Returned call to patient he stated he wanted to have fasting lab work same day he sees Dr.Berry 09/16/17 at 8:45 am.Advised to be fasting that day and just let Dr.Berry know.

## 2017-09-13 ENCOUNTER — Telehealth: Payer: Self-pay | Admitting: Cardiovascular Disease

## 2017-09-13 DIAGNOSIS — E785 Hyperlipidemia, unspecified: Secondary | ICD-10-CM

## 2017-09-13 DIAGNOSIS — I10 Essential (primary) hypertension: Secondary | ICD-10-CM

## 2017-09-13 MED ORDER — SIMVASTATIN 40 MG PO TABS: 40.0000 mg | ORAL_TABLET | Freq: Every day | ORAL | 0 refills | Status: DC

## 2017-09-13 NOTE — Telephone Encounter (Signed)
New message       *STAT* If patient is at the pharmacy, call can be transferred to refill team.   1. Which medications need to be refilled? (please list name of each medication and dose if known)  simvastatin (ZOCOR) 40 MG tablet Take 1 tablet (40 mg total) by mouth daily.     2. Which pharmacy/location (including street and city if local pharmacy) is medication to be sent to? [facility]   3. Do they need a 30 day or 90 day supply? 90

## 2017-09-16 ENCOUNTER — Ambulatory Visit: Payer: Medicare Other | Admitting: Cardiovascular Disease

## 2017-09-16 ENCOUNTER — Encounter: Payer: Self-pay | Admitting: Cardiovascular Disease

## 2017-09-16 VITALS — BP 141/84 | HR 59 | Ht 71.0 in | Wt 184.0 lb

## 2017-09-16 DIAGNOSIS — I10 Essential (primary) hypertension: Secondary | ICD-10-CM

## 2017-09-16 DIAGNOSIS — E7800 Pure hypercholesterolemia, unspecified: Secondary | ICD-10-CM | POA: Diagnosis not present

## 2017-09-16 DIAGNOSIS — Z79899 Other long term (current) drug therapy: Secondary | ICD-10-CM

## 2017-09-16 DIAGNOSIS — E785 Hyperlipidemia, unspecified: Secondary | ICD-10-CM

## 2017-09-16 MED ORDER — NEBIVOLOL HCL 5 MG PO TABS: 5.0000 mg | ORAL_TABLET | Freq: Every day | ORAL | 3 refills | Status: DC

## 2017-09-16 MED ORDER — SIMVASTATIN 40 MG PO TABS: 40.0000 mg | ORAL_TABLET | Freq: Every day | ORAL | 3 refills | Status: DC

## 2017-09-16 MED ORDER — NIACIN ER (ANTIHYPERLIPIDEMIC) 500 MG PO TBCR
500.0000 mg | EXTENDED_RELEASE_TABLET | Freq: Every day | ORAL | 3 refills | Status: DC
Start: 2017-09-16 — End: 2018-09-12

## 2017-09-16 NOTE — Addendum Note (Signed)
Addended by: Evans LanceSTOVER, Vera Furniss W on: 09/16/2017 09:24 AM   Modules accepted: Orders

## 2017-09-16 NOTE — Assessment & Plan Note (Signed)
History of essential hypertension blood pressure measured at 141/84. He is on Bystolic . Continue current meds current dosing.

## 2017-09-16 NOTE — Addendum Note (Signed)
Addended by: Evans LanceSTOVER, Rhealyn Cullen W on: 09/16/2017 09:19 AM   Modules accepted: Orders

## 2017-09-16 NOTE — Assessment & Plan Note (Signed)
History of hyperlipidemia on statin therapy.  We will recheck a lipid and liver profile today 

## 2017-09-16 NOTE — Addendum Note (Signed)
Addended by: Ceciley Buist W on: 09/16/2017 02:38 PM   Modules accepted: Orders  

## 2017-09-16 NOTE — Patient Instructions (Signed)
Medication Instructions: Your physician recommends that you continue on your current medications as directed. Please refer to the Current Medication list given to you today.  Labwork: Your physician recommends that you have lab work drawn today in our office.   Follow-Up: Your physician wants you to follow-up in: 1 year with Dr. Allyson SabalBerry. You will receive a reminder letter in the mail two months in advance. If you don't receive a letter, please call our office to schedule the follow-up appointment.  If you need a refill on your cardiac medications before your next appointment, please call your pharmacy.

## 2017-09-16 NOTE — Addendum Note (Signed)
Addended by: Evans LanceSTOVER, Ranetta Armacost W on: 09/16/2017 09:28 AM   Modules accepted: Orders

## 2017-09-16 NOTE — Progress Notes (Signed)
09/16/2017 Dustin Meager Jr.   1951-03-08  [id number]  Primary Physician Dustin Woodard., MD Primary Cardiologist: Dustin Gess MD FACP, [facility], [place], [place]  HPI:  Dustin Woodard. is a 67 y.o.  married Caucasian male father of 3, grandfather 3 grandchildren referred by Dr. Hoy Woodard to establish in our cardiovascular practice. His primary care physician is Dr. Royanne Woodard in [facility]. 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 I last saw him in the office  09/10/16. History of cardiovascular psychopathology positive for hypertension and hyperlipidemia otherwise is benign. He drinks socially and does not smoke. He denies chest pain or shortness of breath. I last saw him one year ago. He denies chest pain or shortness of breath. 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  he is scheduled to go to [place] in the near future to fish..     No outpatient medications have been marked as taking for the 09/16/17 encounter (Office Visit) with Dustin Gess, MD.     No Known Allergies  Social History   Socioeconomic History  . Marital status: Married    Spouse name: Not on file  . Number of children: Not on file  . Years of education: Not on file  . Highest education level: Not on file  Social Needs  . Financial resource strain: Not on file  . Food insecurity - worry: Not on file  . Food insecurity - inability: Not on file  . Transportation needs - medical: Not on file  . Transportation needs - non-medical: Not on file  Occupational History  . Not on file  Tobacco Use  . Smoking status: Never Smoker  . Smokeless tobacco: Never Used  Substance and Sexual Activity  . Alcohol use: Yes    Comment: occ wine and beer  . Drug use: Not on file  . Sexual activity: Not on file  Other Topics Concern  . Not on file  Social History Narrative  . Not  on file     Review of Systems: General: negative for chills, fever, night sweats or weight changes.  Cardiovascular: negative for chest pain, dyspnea on exertion, edema, orthopnea, palpitations, paroxysmal nocturnal dyspnea or shortness of breath Dermatological: negative for rash Respiratory: negative for cough or wheezing Urologic: negative for hematuria Abdominal: negative for nausea, vomiting, diarrhea, bright red blood per rectum, melena, or hematemesis Neurologic: negative for visual changes, syncope, or dizziness All other systems reviewed and are otherwise negative except as noted above.    Blood pressure (!) 141/84, pulse (!) 59, height 5\' 11"  (1.803 m), weight 184 lb (83.5 kg).  General appearance: alert and no distress Neck: no adenopathy, no carotid bruit, no JVD, supple, symmetrical, trachea midline and thyroid not enlarged, symmetric, no tenderness/mass/nodules Lungs: clear to auscultation bilaterally Heart: regular rate and rhythm, S1, S2 normal, no murmur, click, rub or gallop Extremities: extremities normal, atraumatic, no cyanosis or edema Pulses: 2+ and symmetric Skin: Skin color, texture, turgor normal. No rashes or lesions Neurologic: Alert and oriented X 3, normal strength and tone. Normal symmetric reflexes. Normal coordination and gait  EKG sinus bradycardia at 59 without ST or T-wave changes. I personally reviewed this EKG  ASSESSMENT AND PLAN:   Essential hypertension History of essential hypertension blood pressure measured at 141/84. He is on Bystolic . Continue current meds  current dosing.  Hyperlipidemia History of hyperlipidemia on statin therapy. We will recheck a lipid and liver profile today      Dustin GessJonathan J. Berry MD [facility] LtdFACP,FACC,FAHA, [facility] 09/16/2017 9:12 AM

## 2017-09-17 LAB — CBC WITH DIFFERENTIAL/PLATELET
BASOS ABS: 0 10*3/uL (ref 0.0–0.2)
Basos: 0 %
EOS (ABSOLUTE): 0.1 10*3/uL (ref 0.0–0.4)
Eos: 2 %
HEMOGLOBIN: 15.5 g/dL (ref 13.0–17.7)
Hematocrit: 44.1 % (ref 37.5–51.0)
IMMATURE GRANS (ABS): 0 10*3/uL (ref 0.0–0.1)
IMMATURE GRANULOCYTES: 0 %
LYMPHS: 38 %
Lymphocytes Absolute: 1.5 10*3/uL (ref 0.7–3.1)
MCH: 32.8 pg (ref 26.6–33.0)
MCHC: 35.1 g/dL (ref 31.5–35.7)
MCV: 93 fL (ref 79–97)
MONOCYTES: 10 %
Monocytes Absolute: 0.4 10*3/uL (ref 0.1–0.9)
Neutrophils Absolute: 1.9 10*3/uL (ref 1.4–7.0)
Neutrophils: 50 %
PLATELETS: 248 10*3/uL (ref 150–379)
RBC: 4.72 x10E6/uL (ref 4.14–5.80)
RDW: 12.8 % (ref 12.3–15.4)
WBC: 3.9 10*3/uL (ref 3.4–10.8)

## 2017-09-17 LAB — BASIC METABOLIC PANEL
BUN/Creatinine Ratio: 14 (ref 10–24)
BUN: 14 mg/dL (ref 8–27)
CALCIUM: 9.7 mg/dL (ref 8.6–10.2)
CHLORIDE: 102 mmol/L (ref 96–106)
CO2: 22 mmol/L (ref 20–29)
Creatinine, Ser: 1.02 mg/dL (ref 0.76–1.27)
GFR calc non Af Amer: 77 mL/min/{1.73_m2} (ref >59)
GFR, EST AFRICAN AMERICAN: 89 mL/min/{1.73_m2} (ref >59)
Glucose: 94 mg/dL (ref 65–99)
POTASSIUM: 4.7 mmol/L (ref 3.5–5.2)
Sodium: 138 mmol/L (ref 134–144)

## 2017-09-17 LAB — HEPATIC FUNCTION PANEL
ALBUMIN: 4.8 g/dL (ref 3.6–4.8)
ALT: 22 IU/L (ref 0–44)
AST: 24 IU/L (ref 0–40)
Alkaline Phosphatase: 96 IU/L (ref 39–117)
BILIRUBIN TOTAL: 0.7 mg/dL (ref 0.0–1.2)
Bilirubin, Direct: 0.19 mg/dL (ref 0.00–0.40)
Total Protein: 7.1 g/dL (ref 6.0–8.5)

## 2017-09-17 LAB — LIPID PANEL
CHOL/HDL RATIO: 2.6 ratio (ref 0.0–5.0)
Cholesterol, Total: 143 mg/dL (ref 100–199)
HDL: 54 mg/dL (ref >39)
LDL Calculated: 77 mg/dL (ref 0–99)
TRIGLYCERIDES: 62 mg/dL (ref 0–149)
VLDL CHOLESTEROL CAL: 12 mg/dL (ref 5–40)

## 2017-09-17 LAB — T4, FREE: FREE T4: 1.34 ng/dL (ref 0.82–1.77)

## 2017-09-17 LAB — HEMOGLOBIN A1C
ESTIMATED AVERAGE GLUCOSE: 120 mg/dL
HEMOGLOBIN A1C: 5.8 % — AB (ref 4.8–5.6)

## 2017-09-17 LAB — TSH: TSH: 2.69 u[IU]/mL (ref 0.450–4.500)

## 2017-09-17 LAB — PSA: Prostate Specific Ag, Serum: 3.1 ng/mL (ref 0.0–4.0)

## 2018-07-26 ENCOUNTER — Other Ambulatory Visit: Payer: Self-pay | Admitting: Cardiovascular Disease

## 2018-07-26 DIAGNOSIS — I10 Essential (primary) hypertension: Secondary | ICD-10-CM

## 2018-07-26 DIAGNOSIS — E785 Hyperlipidemia, unspecified: Secondary | ICD-10-CM

## 2018-09-06 ENCOUNTER — Telehealth: Payer: Self-pay | Admitting: Cardiovascular Disease

## 2018-09-06 DIAGNOSIS — E785 Hyperlipidemia, unspecified: Secondary | ICD-10-CM

## 2018-09-06 DIAGNOSIS — I10 Essential (primary) hypertension: Secondary | ICD-10-CM

## 2018-09-06 MED ORDER — SIMVASTATIN 40 MG PO TABS: 40.0000 mg | ORAL_TABLET | Freq: Every day | ORAL | 3 refills | Status: DC

## 2018-09-06 NOTE — Telephone Encounter (Signed)
Returned call to patient he stated he scheduled follow up appointment with Dr.Berry 10/03/18 at 9:00 am.Advised to come fasting and he can go to lab after he sees Dr.Berry.90 day refill for simvastatin sent to pharmacy.

## 2018-09-06 NOTE — Telephone Encounter (Signed)
New Message:      Pt would like to have lab work on the same day he has appt with Dr Allyson SabalBerry on 10-03-18. Please order lab work for him.

## 2018-09-06 NOTE — Telephone Encounter (Signed)
°*  STAT* If patient is at the pharmacy, call can be transferred to refill team.   1. Which medications need to be refilled? (please list name of each medication and dose if known) Simvastatin  2. Which pharmacy/location (including street and city if local pharmacy) is medication to be sent to?[facility]  3. Do they need a 30 day or 90 day supply? 90 and refills- pt ha an appt on 10-03-18

## 2018-09-06 NOTE — Telephone Encounter (Signed)
Refill sent to the pharmacy electronically.  

## 2018-09-10 ENCOUNTER — Other Ambulatory Visit: Payer: Self-pay | Admitting: Cardiovascular Disease

## 2018-09-10 DIAGNOSIS — I10 Essential (primary) hypertension: Secondary | ICD-10-CM

## 2018-09-10 DIAGNOSIS — E785 Hyperlipidemia, unspecified: Secondary | ICD-10-CM

## 2018-10-03 ENCOUNTER — Encounter: Payer: Self-pay | Admitting: Cardiovascular Disease

## 2018-10-03 ENCOUNTER — Encounter (INDEPENDENT_AMBULATORY_CARE_PROVIDER_SITE_OTHER): Payer: Self-pay

## 2018-10-03 ENCOUNTER — Ambulatory Visit: Payer: Medicare Other | Admitting: Cardiovascular Disease

## 2018-10-03 DIAGNOSIS — I10 Essential (primary) hypertension: Secondary | ICD-10-CM

## 2018-10-03 DIAGNOSIS — E782 Mixed hyperlipidemia: Secondary | ICD-10-CM

## 2018-10-03 NOTE — Assessment & Plan Note (Signed)
History of hyperlipidemia on statin therapy.  We will recheck a lipid liver profile 

## 2018-10-03 NOTE — Progress Notes (Signed)
10/03/2018 Dustin Meager Jr.   1951/01/28  [id number]  Primary Physician Dan Maker., MD Primary Cardiologist: Runell Gess MD FACP, [facility], [place], [place]  HPI:  Dustin Hinch. is a 68 y.o.  married Caucasian male father of 3, grandfather 3 grandchildren referred by Dr. Hoy Morn to establish in our cardiovascular practice. His primary care physician is Dr. Royanne Foots in [facility]. 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 I last saw him in the office  09/16/2017. History of cardiovascular psychopathology positive for hypertension and hyperlipidemia otherwise is benign. He drinks socially and does not smoke. He denies chest pain or shortness of breath. I last saw him one year ago. He denies chest pain or shortness of breath. 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  As I saw him a year ago he is remained clinically stable.  He just returned from a week in the [place] fishing.  He denies chest pain or shortness of breath.   Current Meds  Medication Sig  . aspirin 81 MG tablet Take 81 mg by mouth daily.  Marland Kitchen BYSTOLIC 5 MG tablet TAKE 1 TABLET BY MOUTH  DAILY  . niacin (NIASPAN) 500 MG CR tablet TAKE 1 TABLET BY MOUTH AT  BEDTIME  . simvastatin (ZOCOR) 40 MG tablet Take 1 tablet (40 mg total) by mouth daily.     No Known Allergies  Social History   Socioeconomic History  . Marital status: Married    Spouse name: Not on file  . Number of children: Not on file  . Years of education: Not on file  . Highest education level: Not on file  Occupational History  . Not on file  Social Needs  . Financial resource strain: Not on file  . Food insecurity:    Worry: Not on file    Inability: Not on file  . Transportation needs:    Medical: Not on file    Non-medical: Not on file  Tobacco Use  . Smoking status: Never Smoker  . Smokeless tobacco: Never Used    Substance and Sexual Activity  . Alcohol use: Yes    Comment: occ wine and beer  . Drug use: Not on file  . Sexual activity: Not on file  Lifestyle  . Physical activity:    Days per week: Not on file    Minutes per session: Not on file  . Stress: Not on file  Relationships  . Social connections:    Talks on phone: Not on file    Gets together: Not on file    Attends religious service: Not on file    Active member of club or organization: Not on file    Attends meetings of clubs or organizations: Not on file    Relationship status: Not on file  . Intimate partner violence:    Fear of current or ex partner: Not on file    Emotionally abused: Not on file    Physically abused: Not on file    Forced sexual activity: Not on file  Other Topics Concern  . Not on file  Social History Narrative  . Not on file     Review of Systems: General: negative for chills, fever, night sweats or weight changes.  Cardiovascular: negative for chest pain, dyspnea on exertion, edema, orthopnea, palpitations, paroxysmal nocturnal dyspnea or shortness of breath Dermatological: negative for rash  Respiratory: negative for cough or wheezing Urologic: negative for hematuria Abdominal: negative for nausea, vomiting, diarrhea, bright red blood per rectum, melena, or hematemesis Neurologic: negative for visual changes, syncope, or dizziness All other systems reviewed and are otherwise negative except as noted above.    Blood pressure 138/88, pulse (!) 54, height 5\' 11"  (1.803 m), weight 186 lb 3.2 oz (84.5 kg).  General appearance: alert and no distress Neck: no adenopathy, no carotid bruit, no JVD, supple, symmetrical, trachea midline and thyroid not enlarged, symmetric, no tenderness/mass/nodules Lungs: clear to auscultation bilaterally Heart: regular rate and rhythm, S1, S2 normal, no murmur, click, rub or gallop Extremities: extremities normal, atraumatic, no cyanosis or edema Pulses: 2+ and  symmetric Skin: Skin color, texture, turgor normal. No rashes or lesions Neurologic: Alert and oriented X 3, normal strength and tone. Normal symmetric reflexes. Normal coordination and gait  EKG sinus bradycardia 54 with nonspecific ST and T wave changes.  I personally reviewed this EKG  ASSESSMENT AND PLAN:   Essential hypertension History of essential hypertension blood pressure measured today at 138/88.  He is battling an upper respiratory tract infection and has been placed on steroids.  He is on Bystolic.  Hyperlipidemia History of hyperlipidemia on statin therapy.  We will recheck a lipid liver profile.      Runell Gess MD FACP,FACC,FAHA, [facility] 10/03/2018 9:17 AM

## 2018-10-03 NOTE — Patient Instructions (Signed)
Medication Instructions:  none If you need a refill on your cardiac medications before your next appointment, please call your pharmacy.   Lab work: Your physician recommends that you return for lab work in: 1-2 weeks  If you have labs (blood work) drawn today and your tests are completely normal, you will receive your results only by: Marland Kitchen MyChart Message (if you have MyChart) OR . A paper copy in the mail If you have any lab test that is abnormal or we need to change your treatment, we will call you to review the results.  Testing/Procedures: none  Follow-Up: At [facility], you and your health needs are our priority.  As part of our continuing mission to provide you with exceptional heart care, we have created designated Provider Care Teams.  These Care Teams include your primary Cardiologist (physician) and Advanced Practice Providers (APPs -  Physician Assistants and Nurse Practitioners) who all work together to provide you with the care you need, when you need it. . You will need a follow up appointment in 12 months.  Please call our office 2 months in advance to schedule this appointment.  You may see Dr. Allyson Sabal or one of the following Advanced Practice Providers on your designated Care Team:   . Corine Shelter, [place] . Azalee Course, PA-C . Micah Flesher, PA-C . Joni Reining, DNP . Theodore Demark, PA-C . Judy Pimple, PA-C . Marjie Skiff, PA-C

## 2018-10-03 NOTE — Assessment & Plan Note (Signed)
History of essential hypertension blood pressure measured today at 138/88.  He is battling an upper respiratory tract infection and has been placed on steroids.  He is on Bystolic.

## 2018-10-04 ENCOUNTER — Encounter: Payer: Self-pay | Admitting: *Deleted

## 2018-10-04 LAB — COMPREHENSIVE METABOLIC PANEL
ALT: 21 IU/L (ref 0–44)
AST: 21 IU/L (ref 0–40)
Albumin/Globulin Ratio: 2.2 (ref 1.2–2.2)
Albumin: 4.3 g/dL (ref 3.8–4.8)
Alkaline Phosphatase: 77 IU/L (ref 39–117)
BUN/Creatinine Ratio: 15 (ref 10–24)
BUN: 16 mg/dL (ref 8–27)
Bilirubin Total: 0.9 mg/dL (ref 0.0–1.2)
CO2: 23 mmol/L (ref 20–29)
Calcium: 9.8 mg/dL (ref 8.6–10.2)
Chloride: 101 mmol/L (ref 96–106)
Creatinine, Ser: 1.09 mg/dL (ref 0.76–1.27)
GFR calc Af Amer: 81 mL/min/{1.73_m2} (ref >59)
GFR calc non Af Amer: 70 mL/min/{1.73_m2} (ref >59)
GLUCOSE: 92 mg/dL (ref 65–99)
Globulin, Total: 2 g/dL (ref 1.5–4.5)
Potassium: 4.2 mmol/L (ref 3.5–5.2)
Sodium: 138 mmol/L (ref 134–144)
Total Protein: 6.3 g/dL (ref 6.0–8.5)

## 2018-10-04 LAB — CBC
Hematocrit: 42.1 % (ref 37.5–51.0)
Hemoglobin: 14.4 g/dL (ref 13.0–17.7)
MCH: 32 pg (ref 26.6–33.0)
MCHC: 34.2 g/dL (ref 31.5–35.7)
MCV: 94 fL (ref 79–97)
Platelets: 254 10*3/uL (ref 150–450)
RBC: 4.5 x10E6/uL (ref 4.14–5.80)
RDW: 12.5 % (ref 11.6–15.4)
WBC: 6.2 10*3/uL (ref 3.4–10.8)

## 2018-10-04 LAB — LIPID PANEL
Chol/HDL Ratio: 2.9 ratio (ref 0.0–5.0)
Cholesterol, Total: 140 mg/dL (ref 100–199)
HDL: 48 mg/dL (ref >39)
LDL Calculated: 76 mg/dL (ref 0–99)
TRIGLYCERIDES: 82 mg/dL (ref 0–149)
VLDL CHOLESTEROL CAL: 16 mg/dL (ref 5–40)

## 2018-10-04 LAB — TSH+FREE T4
FREE T4: 1.47 ng/dL (ref 0.82–1.77)
TSH: 2.46 u[IU]/mL (ref 0.450–4.500)

## 2018-10-04 LAB — HEPATIC FUNCTION PANEL: Bilirubin, Direct: 0.22 mg/dL (ref 0.00–0.40)

## 2019-02-11 ENCOUNTER — Other Ambulatory Visit: Payer: Self-pay | Admitting: Cardiovascular Disease

## 2019-02-11 DIAGNOSIS — E785 Hyperlipidemia, unspecified: Secondary | ICD-10-CM

## 2019-02-11 DIAGNOSIS — I10 Essential (primary) hypertension: Secondary | ICD-10-CM

## 2019-06-17 ENCOUNTER — Other Ambulatory Visit: Payer: Self-pay | Admitting: Cardiovascular Disease

## 2019-06-17 DIAGNOSIS — I10 Essential (primary) hypertension: Secondary | ICD-10-CM

## 2019-06-17 DIAGNOSIS — E785 Hyperlipidemia, unspecified: Secondary | ICD-10-CM

## 2019-09-06 ENCOUNTER — Other Ambulatory Visit: Payer: Self-pay | Admitting: Cardiovascular Disease

## 2019-09-06 DIAGNOSIS — E785 Hyperlipidemia, unspecified: Secondary | ICD-10-CM

## 2019-09-06 DIAGNOSIS — I10 Essential (primary) hypertension: Secondary | ICD-10-CM

## 2019-10-05 ENCOUNTER — Other Ambulatory Visit: Payer: Self-pay

## 2019-10-05 ENCOUNTER — Encounter: Payer: Self-pay | Admitting: Cardiovascular Disease

## 2019-10-05 ENCOUNTER — Ambulatory Visit (INDEPENDENT_AMBULATORY_CARE_PROVIDER_SITE_OTHER): Payer: Medicare Other | Admitting: Cardiovascular Disease

## 2019-10-05 DIAGNOSIS — I10 Essential (primary) hypertension: Secondary | ICD-10-CM

## 2019-10-05 DIAGNOSIS — E785 Hyperlipidemia, unspecified: Secondary | ICD-10-CM

## 2019-10-05 DIAGNOSIS — E782 Mixed hyperlipidemia: Secondary | ICD-10-CM | POA: Diagnosis not present

## 2019-10-05 LAB — TSH+T4F+T3FREE
Free T4: 1.35 ng/dL (ref 0.82–1.77)
T3, Free: 3.3 pg/mL (ref 2.0–4.4)
TSH: 2.29 u[IU]/mL (ref 0.450–4.500)

## 2019-10-05 LAB — CBC
Hematocrit: 44.4 % (ref 37.5–51.0)
Hemoglobin: 15.2 g/dL (ref 13.0–17.7)
MCH: 32.3 pg (ref 26.6–33.0)
MCHC: 34.2 g/dL (ref 31.5–35.7)
MCV: 95 fL (ref 79–97)
Platelets: 195 10*3/uL (ref 150–450)
RBC: 4.7 x10E6/uL (ref 4.14–5.80)
RDW: 12.3 % (ref 11.6–15.4)
WBC: 4.2 10*3/uL (ref 3.4–10.8)

## 2019-10-05 LAB — BASIC METABOLIC PANEL
BUN/Creatinine Ratio: 13 (ref 10–24)
BUN: 13 mg/dL (ref 8–27)
CO2: 22 mmol/L (ref 20–29)
Calcium: 9.6 mg/dL (ref 8.6–10.2)
Chloride: 103 mmol/L (ref 96–106)
Creatinine, Ser: 1.04 mg/dL (ref 0.76–1.27)
GFR calc Af Amer: 85 mL/min/{1.73_m2} (ref >59)
GFR calc non Af Amer: 74 mL/min/{1.73_m2} (ref >59)
Glucose: 91 mg/dL (ref 65–99)
Potassium: 4.9 mmol/L (ref 3.5–5.2)
Sodium: 141 mmol/L (ref 134–144)

## 2019-10-05 LAB — HEPATIC FUNCTION PANEL
ALT: 21 IU/L (ref 0–44)
AST: 22 IU/L (ref 0–40)
Albumin: 4.6 g/dL (ref 3.8–4.8)
Alkaline Phosphatase: 109 IU/L (ref 39–117)
Bilirubin Total: 0.8 mg/dL (ref 0.0–1.2)
Bilirubin, Direct: 0.21 mg/dL (ref 0.00–0.40)
Total Protein: 6.8 g/dL (ref 6.0–8.5)

## 2019-10-05 LAB — LIPID PANEL
Chol/HDL Ratio: 2.8 ratio (ref 0.0–5.0)
Cholesterol, Total: 155 mg/dL (ref 100–199)
HDL: 56 mg/dL (ref >39)
LDL Chol Calc (NIH): 87 mg/dL (ref 0–99)
Triglycerides: 62 mg/dL (ref 0–149)
VLDL Cholesterol Cal: 12 mg/dL (ref 5–40)

## 2019-10-05 MED ORDER — SIMVASTATIN 40 MG PO TABS: 40.0000 mg | ORAL_TABLET | Freq: Every day | ORAL | 2 refills | Status: DC

## 2019-10-05 MED ORDER — NIACIN ER (ANTIHYPERLIPIDEMIC) 500 MG PO TBCR: 500.0000 mg | EXTENDED_RELEASE_TABLET | Freq: Every day | ORAL | 2 refills | Status: DC

## 2019-10-05 MED ORDER — NEBIVOLOL HCL 5 MG PO TABS: 5.0000 mg | ORAL_TABLET | Freq: Every day | ORAL | 2 refills | Status: DC

## 2019-10-05 NOTE — Assessment & Plan Note (Signed)
History of hyperlipidemia on simvastatin.  His last lipid profile performed 10/03/2018 revealed total cholesterol 140, LDL 76 and HDL of 48.  We will recheck a fasting lipid liver profile this morning.

## 2019-10-05 NOTE — Patient Instructions (Signed)
Medication Instructions:  Your physician recommends that you continue on your current medications as directed. Please refer to the Current Medication list given to you today.  If you need a refill on your cardiac medications before your next appointment, please call your pharmacy.   Lab work: BMET, CBC, Lipids, Hepatic Function, TSH and T3&T4 If you have labs (blood work) drawn today and your tests are completely normal, you will receive your results only by: MyChart Message (if you have MyChart) OR A paper copy in the mail If you have any lab test that is abnormal or we need to change your treatment, we will call you to review the results.  Testing/Procedures: NONE  Follow-Up: At [facility], you and your health needs are our priority.  As part of our continuing mission to provide you with exceptional heart care, we have created designated Provider Care Teams.  These Care Teams include your primary Cardiologist (physician) and Advanced Practice Providers (APPs -  Physician Assistants and Nurse Practitioners) who all work together to provide you with the care you need, when you need it. You may see Nanetta Batty, MD or one of the following Advanced Practice Providers on your designated Care Team:    Corine Shelter, PA-C  [place], [place]  Edd Fabian, [place]  Your physician wants you to follow-up in: 1 year with Dr. Allyson Sabal

## 2019-10-05 NOTE — Progress Notes (Signed)
10/05/2019 Dustin Meager Jr.   1951-01-04  [id number]  Primary Physician Dan Maker., MD Primary Cardiologist: Runell Gess MD FACP, [facility], [place], [place]  HPI:  Dustin Woodard. is a 69 y.o. mildly overweight married Caucasian male father of 3, grandfather 5 grandchildren referred by Dr. Hoy Morn to establish in our cardiovascular practice. His primary care physician is Dr. Royanne Foots in [facility] 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 I last saw him in the office  10/03/2018. History of cardiovascular psychopathology positive for hypertension and hyperlipidemia otherwise is benign. He drinks socially and does not smoke. He denies chest pain or shortness of breath. I last saw him one year ago. He denies chest pain or shortness of breath. 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  Since I saw him a year ago he is remained stable.  He has not traveled because of COVID-19 and has gained a little bit of weight.  He denies chest pain or shortness of breath.   Current Meds  Medication Sig  . aspirin 81 MG tablet Take 81 mg by mouth daily.  Marland Kitchen BYSTOLIC 5 MG tablet TAKE 1 TABLET BY MOUTH  DAILY  . niacin (NIASPAN) 500 MG CR tablet TAKE 1 TABLET BY MOUTH AT  BEDTIME  . simvastatin (ZOCOR) 40 MG tablet TAKE 1 TABLET BY MOUTH  DAILY     No Known Allergies  Social History   Socioeconomic History  . Marital status: Married    Spouse name: Not on file  . Number of children: Not on file  . Years of education: Not on file  . Highest education level: Not on file  Occupational History  . Not on file  Tobacco Use  . Smoking status: Never Smoker  . Smokeless tobacco: Never Used  Substance and Sexual Activity  . Alcohol use: Yes    Comment: occ wine and beer  . Drug use: Not on file  . Sexual activity: Not on file  Other Topics Concern  . Not on file  Social  History Narrative  . Not on file   Social Determinants of Health   Financial Resource Strain:   . Difficulty of Paying Living Expenses: Not on file  Food Insecurity:   . Worried About Programme researcher, broadcasting/film/video in the Last Year: Not on file  . Ran Out of Food in the Last Year: Not on file  Transportation Needs:   . Lack of Transportation (Medical): Not on file  . Lack of Transportation (Non-Medical): Not on file  Physical Activity:   . Days of Exercise per Week: Not on file  . Minutes of Exercise per Session: Not on file  Stress:   . Feeling of Stress : Not on file  Social Connections:   . Frequency of Communication with Friends and Family: Not on file  . Frequency of Social Gatherings with Friends and Family: Not on file  . Attends Religious Services: Not on file  . Active Member of Clubs or Organizations: Not on file  . Attends Banker Meetings: Not on file  . Marital Status: Not on file  Intimate Partner Violence:   . Fear of Current or Ex-Partner: Not on file  . Emotionally Abused: Not on file  . Physically Abused: Not on file  . Sexually Abused: Not on file     Review of Systems: General:  negative for chills, fever, night sweats or weight changes.  Cardiovascular: negative for chest pain, dyspnea on exertion, edema, orthopnea, palpitations, paroxysmal nocturnal dyspnea or shortness of breath Dermatological: negative for rash Respiratory: negative for cough or wheezing Urologic: negative for hematuria Abdominal: negative for nausea, vomiting, diarrhea, bright red blood per rectum, melena, or hematemesis Neurologic: negative for visual changes, syncope, or dizziness All other systems reviewed and are otherwise negative except as noted above.    Blood pressure (!) 160/89, pulse 62, temperature 97.7 F (36.5 C), height 5\' 11"  (1.803 m), SpO2 98 %.  General appearance: alert and no distress Neck: no adenopathy, no carotid bruit, no JVD, supple, symmetrical,  trachea midline and thyroid not enlarged, symmetric, no tenderness/mass/nodules Lungs: clear to auscultation bilaterally Heart: regular rate and rhythm, S1, S2 normal, no murmur, click, rub or gallop Extremities: extremities normal, atraumatic, no cyanosis or edema Pulses: 2+ and symmetric Skin: Skin color, texture, turgor normal. No rashes or lesions Neurologic: Alert and oriented X 3, normal strength and tone. Normal symmetric reflexes. Normal coordination and gait  EKG normal sinus rhythm at 74 with nonspecific ST and T wave changes.  I personally reviewed this EKG.  ASSESSMENT AND PLAN:   Essential hypertension Patient presents hypertension with blood pressure measured today at 160/89.  He is on Bystolic.  Hyperlipidemia History of hyperlipidemia on simvastatin.  His last lipid profile performed 10/03/2018 revealed total cholesterol 140, LDL 76 and HDL of 48.  We will recheck a fasting lipid liver profile this morning.      Lorretta Harp MD FACP,FACC,FAHA, [facility] 10/05/2019 9:32 AM

## 2019-10-05 NOTE — Assessment & Plan Note (Signed)
Patient presents hypertension with blood pressure measured today at 160/89.  He is on Bystolic.

## 2019-10-08 ENCOUNTER — Other Ambulatory Visit: Payer: Self-pay

## 2019-10-08 DIAGNOSIS — E785 Hyperlipidemia, unspecified: Secondary | ICD-10-CM

## 2019-10-08 DIAGNOSIS — I10 Essential (primary) hypertension: Secondary | ICD-10-CM

## 2019-10-08 NOTE — Telephone Encounter (Signed)
Patient saw Dr. Allyson Sabal last week, refills were supposed to go to [facility]. Please send to [facility], not [facility].

## 2019-10-09 MED ORDER — SIMVASTATIN 40 MG PO TABS: 40.0000 mg | ORAL_TABLET | Freq: Every day | ORAL | 3 refills | Status: DC

## 2019-10-09 MED ORDER — NIACIN ER (ANTIHYPERLIPIDEMIC) 500 MG PO TBCR: 500.0000 mg | EXTENDED_RELEASE_TABLET | Freq: Every day | ORAL | 3 refills | Status: DC

## 2019-10-09 MED ORDER — NEBIVOLOL HCL 5 MG PO TABS: 5.0000 mg | ORAL_TABLET | Freq: Every day | ORAL | 3 refills | Status: DC

## 2019-10-12 ENCOUNTER — Telehealth: Payer: Self-pay | Admitting: Cardiovascular Disease

## 2019-10-12 NOTE — Telephone Encounter (Signed)
We are recommending the COVID-19 vaccine to all of our patients. Cardiac medications (including blood thinners) should not deter anyone from being vaccinated and there is no need to hold any of those medications prior to vaccine administration.     Currently, there is a hotline to call (active 09/07/19) to schedule vaccination appointments as no walk-ins will be accepted.   Number: [phone].    If an appointment is not available please go to [email or web address] to sign up for notification when additional vaccine appointments are available.   If you have further questions or concerns about the vaccine process, please visit [email or web address] or contact your primary care physician.   

## 2020-09-08 ENCOUNTER — Other Ambulatory Visit: Payer: Self-pay

## 2020-09-08 ENCOUNTER — Telehealth: Payer: Self-pay | Admitting: Cardiovascular Disease

## 2020-09-08 DIAGNOSIS — I10 Essential (primary) hypertension: Secondary | ICD-10-CM

## 2020-09-08 DIAGNOSIS — E782 Mixed hyperlipidemia: Secondary | ICD-10-CM

## 2020-09-08 NOTE — Telephone Encounter (Signed)
Just checking, I ordered the TSH, Free t4, Free t3, Lipid, Liver, BMET, CBC from previous from last year. Wanted to make sure I was not missing anything else for patient's visit in March.  Thanks!

## 2020-09-08 NOTE — Telephone Encounter (Signed)
Patient needs lab orders placed so he can get them done at his yearly appt on 11/12/20 with Dr. Allyson Sabal.

## 2020-09-18 ENCOUNTER — Other Ambulatory Visit: Payer: Self-pay | Admitting: Cardiovascular Disease

## 2020-09-18 DIAGNOSIS — I10 Essential (primary) hypertension: Secondary | ICD-10-CM

## 2020-09-18 DIAGNOSIS — E785 Hyperlipidemia, unspecified: Secondary | ICD-10-CM

## 2020-09-30 ENCOUNTER — Other Ambulatory Visit: Payer: Self-pay | Admitting: Cardiovascular Disease

## 2020-09-30 DIAGNOSIS — E785 Hyperlipidemia, unspecified: Secondary | ICD-10-CM

## 2020-09-30 DIAGNOSIS — I10 Essential (primary) hypertension: Secondary | ICD-10-CM

## 2020-10-02 ENCOUNTER — Telehealth: Payer: Self-pay | Admitting: Cardiovascular Disease

## 2020-10-02 DIAGNOSIS — E785 Hyperlipidemia, unspecified: Secondary | ICD-10-CM

## 2020-10-02 DIAGNOSIS — I10 Essential (primary) hypertension: Secondary | ICD-10-CM

## 2020-10-02 MED ORDER — NIACIN ER (ANTIHYPERLIPIDEMIC) 500 MG PO TBCR: 500.0000 mg | EXTENDED_RELEASE_TABLET | Freq: Every day | ORAL | 0 refills | Status: DC

## 2020-10-02 MED ORDER — SIMVASTATIN 40 MG PO TABS: 40.0000 mg | ORAL_TABLET | Freq: Every day | ORAL | 0 refills | Status: DC

## 2020-10-02 MED ORDER — NEBIVOLOL HCL 5 MG PO TABS: 5.0000 mg | ORAL_TABLET | Freq: Every day | ORAL | 0 refills | Status: DC

## 2020-10-02 NOTE — Telephone Encounter (Signed)
Let patient know that refills were sent to the pharmacy

## 2020-10-02 NOTE — Telephone Encounter (Signed)
*  STAT* If patient is at the pharmacy, call can be transferred to refill team.   1. Which medications need to be refilled? (please list name of each medication and dose if known)  simvastatin (ZOCOR) 40 MG tablet BYSTOLIC 5 MG tablet niacin (NIASPAN) 500 MG CR tablet  2. Which pharmacy/location (including street and city if local pharmacy) is medication to be sent to? [facility] - [place], [place] - [address], [address]  3. Do they need a 30 day or 90 day supply? 90 with refills

## 2020-11-12 ENCOUNTER — Other Ambulatory Visit: Payer: Self-pay

## 2020-11-12 ENCOUNTER — Ambulatory Visit: Payer: Medicare Other | Admitting: Cardiovascular Disease

## 2020-11-12 ENCOUNTER — Encounter: Payer: Self-pay | Admitting: Cardiovascular Disease

## 2020-11-12 DIAGNOSIS — I10 Essential (primary) hypertension: Secondary | ICD-10-CM

## 2020-11-12 DIAGNOSIS — E782 Mixed hyperlipidemia: Secondary | ICD-10-CM

## 2020-11-12 LAB — HEPATIC FUNCTION PANEL
ALT: 24 IU/L (ref 0–44)
AST: 25 IU/L (ref 0–40)
Albumin: 4.8 g/dL (ref 3.8–4.8)
Alkaline Phosphatase: 117 IU/L (ref 44–121)
Bilirubin Total: 1.2 mg/dL (ref 0.0–1.2)
Bilirubin, Direct: 0.27 mg/dL (ref 0.00–0.40)
Total Protein: 7.2 g/dL (ref 6.0–8.5)

## 2020-11-12 LAB — LIPID PANEL
Chol/HDL Ratio: 2.8 ratio (ref 0.0–5.0)
Cholesterol, Total: 164 mg/dL (ref 100–199)
HDL: 59 mg/dL (ref >39)
LDL Chol Calc (NIH): 93 mg/dL (ref 0–99)
Triglycerides: 62 mg/dL (ref 0–149)
VLDL Cholesterol Cal: 12 mg/dL (ref 5–40)

## 2020-11-12 NOTE — Assessment & Plan Note (Signed)
History of essential hypertension blood pressure measured today 132/82.  He is on Bystolic.

## 2020-11-12 NOTE — Assessment & Plan Note (Signed)
History of hyperlipidemia on statin therapy with lipid profile performed 10/05/2019 revealing total cholesterol 155, LDL of 87 and HDL 56.  We will recheck a lipid liver profile this morning

## 2020-11-12 NOTE — Patient Instructions (Signed)

## 2020-11-12 NOTE — Progress Notes (Signed)
11/12/2020 Dustin Meager Jr.   1951-06-30  [id number]  Primary Physician Dan Maker., MD Primary Cardiologist: Runell Gess MD FACP, [facility], [place], [place]  HPI:  Dustin Woodard. is a 70 y.o.  mildly overweight married Caucasian male father of 3, grandfather 5 grandchildren referred by Dr. Hoy Morn to establish in our cardiovascular practice. His primary care physician is Dr. Royanne Foots in [facility]. 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 I last saw him in the office 10/05/2019. History of cardiovascular risk factor profile is positive for hypertension and hyperlipidemia otherwise is benign. He drinks socially and does not smoke. He denies chest pain or shortness of breath. I He is an active fisherman and  hunter and has takenatripsto [place] as well as the [place] and has hunted in [place].  Since I saw him a year ago he is done well.  He denies chest pain or shortness of breath.  He is scheduled to go [place] the [place] in the upcoming future.   Current Meds  Medication Sig  . aspirin 81 MG tablet Take 81 mg by mouth daily.  . nebivolol (BYSTOLIC) 5 MG tablet Take 1 tablet (5 mg total) by mouth daily.  . niacin (NIASPAN) 500 MG CR tablet Take 1 tablet (500 mg total) by mouth at bedtime.  . simvastatin (ZOCOR) 40 MG tablet Take 1 tablet (40 mg total) by mouth daily.     No Known Allergies  Social History   Socioeconomic History  . Marital status: Married    Spouse name: Not on file  . Number of children: Not on file  . Years of education: Not on file  . Highest education level: Not on file  Occupational History  . Not on file  Tobacco Use  . Smoking status: Never Smoker  . Smokeless tobacco: Never Used  Substance and Sexual Activity  . Alcohol use: Yes    Comment: occ wine and beer  . Drug use: Not on file  . Sexual activity: Not on file  Other Topics Concern  . Not on file  Social History  Narrative  . Not on file   Social Determinants of Health   Financial Resource Strain: Not on file  Food Insecurity: Not on file  Transportation Needs: Not on file  Physical Activity: Not on file  Stress: Not on file  Social Connections: Not on file  Intimate Partner Violence: Not on file     Review of Systems: General: negative for chills, fever, night sweats or weight changes.  Cardiovascular: negative for chest pain, dyspnea on exertion, edema, orthopnea, palpitations, paroxysmal nocturnal dyspnea or shortness of breath Dermatological: negative for rash Respiratory: negative for cough or wheezing Urologic: negative for hematuria Abdominal: negative for nausea, vomiting, diarrhea, bright red blood per rectum, melena, or hematemesis Neurologic: negative for visual changes, syncope, or dizziness All other systems reviewed and are otherwise negative except as noted above.    Blood pressure 132/82, pulse (!) 55, height 5\' 11"  (1.803 m), weight 194 lb (88 kg), SpO2 97 %.  General appearance: alert and no distress Neck: no adenopathy, no carotid bruit, no JVD, supple, symmetrical, trachea midline and thyroid not enlarged, symmetric, no tenderness/mass/nodules Lungs: clear to auscultation bilaterally Heart: normal apical impulse Extremities: extremities normal, atraumatic, no cyanosis or edema Pulses: 2+ and symmetric Skin: Skin color, texture, turgor normal. No rashes or lesions Neurologic: Alert and oriented X 3, normal strength and  tone. Normal symmetric reflexes. Normal coordination and gait  EKG sinus bradycardia 55 without ST or T wave changes.  Personally reviewed this EKG.  ASSESSMENT AND PLAN:   Essential hypertension History of essential hypertension blood pressure measured today 132/82.  He is on Bystolic.  Hyperlipidemia History of hyperlipidemia on statin therapy with lipid profile performed 10/05/2019 revealing total cholesterol 155, LDL of 87 and HDL 56.  We will  recheck a lipid liver profile this morning      Runell Gess MD [facility], [facility] 11/12/2020 10:20 AM

## 2020-12-13 ENCOUNTER — Other Ambulatory Visit: Payer: Self-pay | Admitting: Cardiovascular Disease

## 2020-12-13 DIAGNOSIS — E785 Hyperlipidemia, unspecified: Secondary | ICD-10-CM

## 2020-12-13 DIAGNOSIS — I10 Essential (primary) hypertension: Secondary | ICD-10-CM

## 2020-12-15 NOTE — Telephone Encounter (Signed)
Rx has been sent to the pharmacy electronically. ° °

## 2021-01-01 ENCOUNTER — Other Ambulatory Visit: Payer: Self-pay | Admitting: Cardiovascular Disease

## 2021-01-01 DIAGNOSIS — E785 Hyperlipidemia, unspecified: Secondary | ICD-10-CM

## 2021-01-01 DIAGNOSIS — I10 Essential (primary) hypertension: Secondary | ICD-10-CM

## 2021-09-03 ENCOUNTER — Other Ambulatory Visit: Payer: Self-pay | Admitting: Cardiovascular Disease

## 2021-09-03 DIAGNOSIS — E785 Hyperlipidemia, unspecified: Secondary | ICD-10-CM

## 2021-09-03 DIAGNOSIS — I10 Essential (primary) hypertension: Secondary | ICD-10-CM

## 2021-11-13 ENCOUNTER — Ambulatory Visit: Payer: Medicare Other | Admitting: Cardiovascular Disease

## 2021-11-13 ENCOUNTER — Telehealth: Payer: Self-pay | Admitting: Cardiovascular Disease

## 2021-11-13 ENCOUNTER — Other Ambulatory Visit: Payer: Self-pay

## 2021-11-13 ENCOUNTER — Encounter: Payer: Self-pay | Admitting: Cardiovascular Disease

## 2021-11-13 DIAGNOSIS — I10 Essential (primary) hypertension: Secondary | ICD-10-CM

## 2021-11-13 DIAGNOSIS — E785 Hyperlipidemia, unspecified: Secondary | ICD-10-CM | POA: Diagnosis not present

## 2021-11-13 MED ORDER — NEBIVOLOL HCL 5 MG PO TABS: 5.0000 mg | ORAL_TABLET | Freq: Every day | ORAL | 3 refills | Status: DC

## 2021-11-13 NOTE — Patient Instructions (Signed)
Medication Instructions:  ?No changes ?*If you need a refill on your cardiac medications before your next appointment, please call your pharmacy* ? ? ?Lab Work: ?Your provider would like for you to have the following labs today: Lipid, liver, CBC, BMET, A1C, TSH, PSA, Lipopritein LPa ? ?If you have labs (blood work) drawn today and your tests are completely normal, you will receive your results only by: ?MyChart Message (if you have MyChart) OR ?A paper copy in the mail ?If you have any lab test that is abnormal or we need to change your treatment, we will call you to review the results. ? ? ?Testing/Procedures: ?None ordered ? ? ?Follow-Up: ?At [facility], you and your health needs are our priority.  As part of our continuing mission to provide you with exceptional heart care, we have created designated Provider Care Teams.  These Care Teams include your primary Cardiologist (physician) and Advanced Practice Providers (APPs -  Physician Assistants and Nurse Practitioners) who all work together to provide you with the care you need, when you need it. ? ?We recommend signing up for the patient portal called "MyChart".  Sign up information is provided on this After Visit Summary.  MyChart is used to connect with patients for Virtual Visits (Telemedicine).  Patients are able to view lab/test results, encounter notes, upcoming appointments, etc.  Non-urgent messages can be sent to your provider as well.   ?To learn more about what you can do with MyChart, go to [email or web address].   ? ?Your next appointment:   ?12 month(s) ? ?The format for your next appointment:   ?In Person ? ?Provider:   ?Nanetta Batty, MD { ? ? ?

## 2021-11-13 NOTE — Assessment & Plan Note (Signed)
History of hyperlipidemia on statin therapy with lipid profile performed 11/12/2020 revealing total cholesterol 164, LDL of 93 and an HDL of 59.  He is also on niacin which I told him he can discontinue.  We will check a lipid liver profile on him this morning. ?

## 2021-11-13 NOTE — Assessment & Plan Note (Signed)
History of essential hypertension a blood pressure measured today at 146/80.  He is on Bystolic. ?

## 2021-11-13 NOTE — Telephone Encounter (Signed)
Pt c/o medication issue: ? ?1. Name of Medication: nebivolol (BYSTOLIC) 5 MG tablet ? ?2. How are you currently taking this medication (dosage and times per day)? As directed ? ?3. Are you having a reaction (difficulty breathing--STAT)? no ? ?4. What is your medication issue? Cost ? ?Patient said the cost of the rx went up. He wanted to know if Dr. Allyson Sabal would be OK with him taking the generic version of the medication. Please advise  ?

## 2021-11-13 NOTE — Telephone Encounter (Signed)
I contacted patient, he states that he would like to know if he can take generic on Bystolic- he was always told not too. I advised the print out RX today has it as the generic however, he would like Dr.Berry's opinion on this. The name brand is too expensive, he has checked around everywhere. ? ? ?Thank you!  ? ?

## 2021-11-13 NOTE — Progress Notes (Signed)
? ? ? ?11/13/2021 ?Tracey Harries.   ?19-Jul-1951  ?[id number] ? ?Primary Physician Dan Maker., MD ?Primary Cardiologist: Runell Gess MD [place], [place] ? ?HPI:  Dustin Woodard. is a 71 y.o.  mildly overweight married Caucasian male father of 3, grandfather 5 grandchildren referred by Dr. Hoy Morn to establish in our cardiovascular practice. His primary care physician is Dr. Royanne Foots in [facility]. 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 I last saw him in the office 11/12/2020. History of cardiovascular risk factor profile is positive for hypertension and hyperlipidemia otherwise is benign. He drinks socially and does not smoke. He denies chest pain or shortness of breath. I He is an active fisherman and  hunter and has taken a trips to [place] as well as the [place] and has hunted in [place] . ?  ?Since I saw him a year ago he is done well.  He denies chest pain or shortness of breath.  He went [employer] off the [place] but did not catch any [place], he did catch bone fish however. ? ?Current Meds  ?Medication Sig  ? aspirin 81 MG tablet Take 81 mg by mouth daily.  ? niacin (NIASPAN) 500 MG CR tablet TAKE 1 TABLET BY MOUTH AT  BEDTIME  ? simvastatin (ZOCOR) 40 MG tablet TAKE 1 TABLET BY MOUTH  DAILY  ? [DISCONTINUED] BYSTOLIC 5 MG tablet TAKE 1 TABLET BY MOUTH  DAILY  ?  ? ?No Known Allergies ? ?Social History  ? ?Socioeconomic History  ? Marital status: Married  ?  Spouse name: Not on file  ? Number of children: Not on file  ? Years of education: Not on file  ? Highest education level: Not on file  ?Occupational History  ? Not on file  ?Tobacco Use  ? Smoking status: Never  ? Smokeless tobacco: Never  ?Substance and Sexual Activity  ? Alcohol use: Yes  ?  Comment: occ wine and beer  ? Drug use: Not on file  ? Sexual activity: Not on file  ?Other Topics Concern  ? Not on file  ?Social History Narrative  ? Not on file  ? ?Social  Determinants of Health  ? ?Financial Resource Strain: Not on file  ?Food Insecurity: Not on file  ?Transportation Needs: Not on file  ?Physical Activity: Not on file  ?Stress: Not on file  ?Social Connections: Not on file  ?Intimate Partner Violence: Not on file  ?  ? ?Review of Systems: ?General: negative for chills, fever, night sweats or weight changes.  ?Cardiovascular: negative for chest pain, dyspnea on exertion, edema, orthopnea, palpitations, paroxysmal nocturnal dyspnea or shortness of breath ?Dermatological: negative for rash ?Respiratory: negative for cough or wheezing ?Urologic: negative for hematuria ?Abdominal: negative for nausea, vomiting, diarrhea, bright red blood per rectum, melena, or hematemesis ?Neurologic: negative for visual changes, syncope, or dizziness ?All other systems reviewed and are otherwise negative except as noted above. ? ? ? ?Blood pressure (!) 146/80, pulse (!) 54, height 5\' 11"  (1.803 m), weight 188 lb 12.8 oz (85.6 kg), SpO2 96 %.  ?General appearance: alert and no distress ?Neck: no adenopathy, no carotid bruit, no JVD, supple, symmetrical, trachea midline, and thyroid not enlarged, symmetric, no tenderness/mass/nodules ?Lungs: clear to auscultation bilaterally ?Heart: regular rate and rhythm, S1, S2 normal, no murmur, click, rub or gallop ?Extremities: extremities normal, atraumatic, no cyanosis or edema ?Pulses: 2+ and symmetric ?Skin: Skin color, texture, turgor  normal. No rashes or lesions ?Neurologic: Grossly normal ? ?EKG sinus bradycardia 54 without ST or T wave changes.  I personally reviewed this EKG. ? ?ASSESSMENT AND PLAN:  ? ?Essential hypertension ?History of essential hypertension a blood pressure measured today at 146/80.  He is on Bystolic. ? ?Hyperlipidemia ?History of hyperlipidemia on statin therapy with lipid profile performed 11/12/2020 revealing total cholesterol 164, LDL of 93 and an HDL of 59.  He is also on niacin which I told him he can discontinue.   We will check a lipid liver profile on him this morning. ? ? ? ? ?Runell Gess MD FACP,FACC,FAHA, FSCAI ?11/13/2021 ?8:15 AM ?

## 2021-11-14 LAB — BASIC METABOLIC PANEL
BUN/Creatinine Ratio: 16 (ref 10–24)
BUN: 15 mg/dL (ref 8–27)
CO2: 24 mmol/L (ref 20–29)
Calcium: 9.7 mg/dL (ref 8.6–10.2)
Chloride: 102 mmol/L (ref 96–106)
Creatinine, Ser: 0.93 mg/dL (ref 0.76–1.27)
Glucose: 100 mg/dL — ABNORMAL HIGH (ref 70–99)
Potassium: 4.8 mmol/L (ref 3.5–5.2)
Sodium: 139 mmol/L (ref 134–144)
eGFR: 89 mL/min/{1.73_m2} (ref >59)

## 2021-11-14 LAB — HEPATIC FUNCTION PANEL
ALT: 20 IU/L (ref 0–44)
AST: 24 IU/L (ref 0–40)
Albumin: 4.8 g/dL (ref 3.8–4.8)
Alkaline Phosphatase: 106 IU/L (ref 44–121)
Bilirubin Total: 0.7 mg/dL (ref 0.0–1.2)
Bilirubin, Direct: 0.22 mg/dL (ref 0.00–0.40)
Total Protein: 6.7 g/dL (ref 6.0–8.5)

## 2021-11-14 LAB — CBC
Hematocrit: 43.1 % (ref 37.5–51.0)
Hemoglobin: 14.9 g/dL (ref 13.0–17.7)
MCH: 32.5 pg (ref 26.6–33.0)
MCHC: 34.6 g/dL (ref 31.5–35.7)
MCV: 94 fL (ref 79–97)
Platelets: 181 10*3/uL (ref 150–450)
RBC: 4.58 x10E6/uL (ref 4.14–5.80)
RDW: 12.1 % (ref 11.6–15.4)
WBC: 3.9 10*3/uL (ref 3.4–10.8)

## 2021-11-14 LAB — LIPID PANEL
Chol/HDL Ratio: 2.9 ratio (ref 0.0–5.0)
Cholesterol, Total: 162 mg/dL (ref 100–199)
HDL: 56 mg/dL (ref >39)
LDL Chol Calc (NIH): 92 mg/dL (ref 0–99)
Triglycerides: 70 mg/dL (ref 0–149)
VLDL Cholesterol Cal: 14 mg/dL (ref 5–40)

## 2021-11-14 LAB — TSH: TSH: 3.52 u[IU]/mL (ref 0.450–4.500)

## 2021-11-14 LAB — HEMOGLOBIN A1C
Est. average glucose Bld gHb Est-mCnc: 114 mg/dL
Hgb A1c MFr Bld: 5.6 % (ref 4.8–5.6)

## 2021-11-14 LAB — PSA: Prostate Specific Ag, Serum: 3 ng/mL (ref 0.0–4.0)

## 2021-11-14 LAB — LIPOPROTEIN A (LPA): Lipoprotein (a): 34.4 nmol/L (ref <75.0)

## 2021-11-16 NOTE — Telephone Encounter (Signed)
Called patient, gave response from MD.  Patient verbalized understanding.   

## 2021-11-18 ENCOUNTER — Other Ambulatory Visit: Payer: Self-pay | Admitting: Cardiovascular Disease

## 2021-11-18 DIAGNOSIS — I10 Essential (primary) hypertension: Secondary | ICD-10-CM

## 2021-11-18 DIAGNOSIS — E785 Hyperlipidemia, unspecified: Secondary | ICD-10-CM

## 2021-12-04 ENCOUNTER — Other Ambulatory Visit: Payer: Self-pay | Admitting: Cardiovascular Disease

## 2021-12-04 DIAGNOSIS — I10 Essential (primary) hypertension: Secondary | ICD-10-CM

## 2021-12-04 DIAGNOSIS — E785 Hyperlipidemia, unspecified: Secondary | ICD-10-CM

## 2022-07-05 ENCOUNTER — Telehealth: Payer: Self-pay | Admitting: Cardiovascular Disease

## 2022-07-05 NOTE — Telephone Encounter (Signed)
Spoke with pt wife, she is asking what medication Dr Gwenlyn Found stopped at his last appointment. Per last office note the patient was to stop niacin.

## 2022-07-05 NOTE — Telephone Encounter (Signed)
Pt c/o medication issue:  1. Name of Medication:    2. How are you currently taking this medication (dosage and times per day)?    3. Are you having a reaction (difficulty breathing--STAT)? no  4. What is your medication issue? Wife calling with question on what medication the patient is suppose to be taking. Please advise

## 2022-08-10 ENCOUNTER — Other Ambulatory Visit: Payer: Self-pay | Admitting: Cardiovascular Disease

## 2022-08-10 DIAGNOSIS — E785 Hyperlipidemia, unspecified: Secondary | ICD-10-CM

## 2022-08-10 DIAGNOSIS — I10 Essential (primary) hypertension: Secondary | ICD-10-CM

## 2022-10-19 ENCOUNTER — Telehealth: Payer: Self-pay | Admitting: Cardiovascular Disease

## 2022-10-19 NOTE — Telephone Encounter (Signed)
Spoke with pt regarding his lab work. Pt would like to come to appointment fasting to have labs drawn on that day. Pt verbalizes understanding.

## 2022-10-19 NOTE — Telephone Encounter (Signed)
Pt is calling to get labs ordered for yearly appt currently scheduled for 05/29.

## 2022-11-17 ENCOUNTER — Encounter: Payer: Self-pay | Admitting: Cardiovascular Disease

## 2022-11-17 ENCOUNTER — Ambulatory Visit: Payer: Medicare Other | Attending: Cardiovascular Disease | Admitting: Cardiovascular Disease

## 2022-11-17 VITALS — BP 140/78 | HR 62 | Ht 71.0 in | Wt 185.0 lb

## 2022-11-17 DIAGNOSIS — I10 Essential (primary) hypertension: Secondary | ICD-10-CM

## 2022-11-17 DIAGNOSIS — E782 Mixed hyperlipidemia: Secondary | ICD-10-CM

## 2022-11-17 NOTE — Patient Instructions (Addendum)
Medication Instructions:  Your physician recommends that you continue on your current medications as directed. Please refer to the Current Medication list given to you today.  *If you need a refill on your cardiac medications before your next appointment, please call your pharmacy*   Lab Work: Your physician recommends that you have labs drawn today: CMET, CBC, Lipids, TSH, PSA  If you have labs (blood work) drawn today and your tests are completely normal, you will receive your results only by: [place] (if you have MyChart) OR A paper copy in the mail If you have any lab test that is abnormal or we need to change your treatment, we will call you to review the results.   Testing/Procedures: Dr. Gwenlyn Found has ordered a CT coronary calcium score.   Test locations:  [place]   This is $99 out of pocket.   Coronary CalciumScan A coronary calcium scan is an imaging test used to look for deposits of calcium and other fatty materials (plaques) in the inner lining of the blood vessels of the heart (coronary arteries). These deposits of calcium and plaques can partly clog and narrow the coronary arteries without producing any symptoms or warning signs. This puts a person at risk for a heart attack. This test can detect these deposits before symptoms develop. Tell a health care provider about: Any allergies you have. All medicines you are taking, including vitamins, herbs, eye drops, creams, and over-the-counter medicines. Any problems you or family members have had with anesthetic medicines. Any blood disorders you have. Any surgeries you have had. Any medical conditions you have. Whether you are pregnant or may be pregnant. What are the risks? Generally, this is a safe procedure. However, problems may occur, including: Harm to a pregnant woman and her unborn baby. This test involves the use of radiation. Radiation exposure can be dangerous to a  pregnant woman and her unborn baby. If you are pregnant, you generally should not have this procedure done. Slight increase in the risk of cancer. This is because of the radiation involved in the test. What happens before the procedure? No preparation is needed for this procedure. What happens during the procedure? You will undress and remove any jewelry around your neck or chest. You will put on a hospital gown. Sticky electrodes will be placed on your chest. The electrodes will be connected to an electrocardiogram (ECG) machine to record a tracing of the electrical activity of your heart. A CT scanner will take pictures of your heart. During this time, you will be asked to lie still and hold your breath for 2-3 seconds while a picture of your heart is being taken. The procedure may vary among health care providers and hospitals. What happens after the procedure? You can get dressed. You can return to your normal activities. It is up to you to get the results of your test. Ask your health care provider, or the department that is doing the test, when your results will be ready. Summary A coronary calcium scan is an imaging test used to look for deposits of calcium and other fatty materials (plaques) in the inner lining of the blood vessels of the heart (coronary arteries). Generally, this is a safe procedure. Tell your health care provider if you are pregnant or may be pregnant. No preparation is needed for this procedure. A CT scanner will take pictures of your heart. You can return to your normal activities after the scan is done. This information  is not intended to replace advice given to you by your health care provider. Make sure you discuss any questions you have with your health care provider. Document Released: 02/12/2008 Document Revised: 07/05/2016 Document Reviewed: 07/05/2016 Elsevier Interactive Patient Education  2017 [place]: At [facility], you  and your health needs are our priority.  As part of our continuing mission to provide you with exceptional heart care, we have created designated Provider Care Teams.  These Care Teams include your primary Cardiologist (physician) and Advanced Practice Providers (APPs -  Physician Assistants and Nurse Practitioners) who all work together to provide you with the care you need, when you need it.  We recommend signing up for the patient portal called "MyChart".  Sign up information is provided on this After Visit Summary.  MyChart is used to connect with patients for Virtual Visits (Telemedicine).  Patients are able to view lab/test results, encounter notes, upcoming appointments, etc.  Non-urgent messages can be sent to your provider as well.   To learn more about what you can do with MyChart, go to [email or web address].    Your next appointment:   12 month(s)  Provider:   Quay Burow, MD

## 2022-11-17 NOTE — Assessment & Plan Note (Signed)
History of hyperlipidemia on statin therapy with lipid profile performed 11/13/2021 revealing total cholesterol 162, LDL 92 and HDL of 56.

## 2022-11-17 NOTE — Progress Notes (Signed)
11/17/2022 Dustin Ferdinand Jr.   1951-01-08  [id number]  Primary Physician Myrtis Hopping., MD Primary Cardiologist: Lorretta Harp MD FACP, [facility], [place], [place]  HPI:  Dustin Woodard. is a 72 y.o.  mildly overweight married Caucasian male father of 51, grandfather 5 grandchildren referred by Dr. Karsten Fells to establish in our cardiovascular practice. His primary care physician was Dr. Rita Ohara in [facility] who recently retired. 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 I last saw him in the office 11/13/2021. History of cardiovascular risk factor profile is positive for hypertension and hyperlipidemia otherwise is benign. He drinks socially and does not smoke. He denies chest pain or shortness of breath. I He is an active fisherman and  hunter and has taken a trips to [place] as well as the [place] and has hunted in [place] .   Since I saw him a year ago he is done well.  He denies chest pain or shortness of breath.  He is going on multiple fishing trips with his 63 year old grandson is currently teaching him to [place] hunt.   No outpatient medications have been marked as taking for the 11/17/22 encounter (Office Visit) with Lorretta Harp, MD.     No Known Allergies  Social History   Socioeconomic History   Marital status: Married    Spouse name: Not on file   Number of children: Not on file   Years of education: Not on file   Highest education level: Not on file  Occupational History   Not on file  Tobacco Use   Smoking status: Never   Smokeless tobacco: Never  Substance and Sexual Activity   Alcohol use: Yes    Comment: occ wine and beer   Drug use: Not on file   Sexual activity: Not on file  Other Topics Concern   Not on file  Social History Narrative   Not on file   Social Determinants of Health   Financial Resource Strain: Not on file  Food Insecurity: Not on file  Transportation Needs: Not on file  Physical  Activity: Not on file  Stress: Not on file  Social Connections: Not on file  Intimate Partner Violence: Not on file     Review of Systems: General: negative for chills, fever, night sweats or weight changes.  Cardiovascular: negative for chest pain, dyspnea on exertion, edema, orthopnea, palpitations, paroxysmal nocturnal dyspnea or shortness of breath Dermatological: negative for rash Respiratory: negative for cough or wheezing Urologic: negative for hematuria Abdominal: negative for nausea, vomiting, diarrhea, bright red blood per rectum, melena, or hematemesis Neurologic: negative for visual changes, syncope, or dizziness All other systems reviewed and are otherwise negative except as noted above.    Blood pressure (!) 140/78, pulse 62, height 5\' 11"  (1.803 m), weight 185 lb (83.9 kg).  General appearance: alert and no distress Neck: no adenopathy, no carotid bruit, no JVD, supple, symmetrical, trachea midline, and thyroid not enlarged, symmetric, no tenderness/mass/nodules Lungs: clear to auscultation bilaterally Heart: regular rate and rhythm, S1, S2 normal, no murmur, click, rub or gallop Extremities: extremities normal, atraumatic, no cyanosis or edema Pulses: 2+ and symmetric Skin: Skin color, texture, turgor normal. No rashes or lesions Neurologic: Grossly normal  EKG sinus rhythm at 62 without ST or T wave changes.  Personally reviewed this EKG.    ASSESSMENT AND PLAN:   Essential hypertension History of essential hypertension blood pressure measured today at 140/79.  He  is on Bystolic.  Hyperlipidemia History of hyperlipidemia on statin therapy with lipid profile performed 11/13/2021 revealing total cholesterol 162, LDL 92 and HDL of 56.     Lorretta Harp MD FACP,FACC,FAHA, [facility] 11/17/2022 9:44 AM

## 2022-11-17 NOTE — Assessment & Plan Note (Signed)
History of essential hypertension blood pressure measured today at 140/79.  He is on Bystolic.

## 2022-11-18 LAB — COMPREHENSIVE METABOLIC PANEL
ALT: 16 IU/L (ref 0–44)
AST: 20 IU/L (ref 0–40)
Albumin/Globulin Ratio: 2.1 (ref 1.2–2.2)
Albumin: 4.8 g/dL (ref 3.9–4.9)
Alkaline Phosphatase: 109 IU/L (ref 44–121)
BUN/Creatinine Ratio: 12 (ref 10–24)
BUN: 13 mg/dL (ref 8–27)
Bilirubin Total: 1 mg/dL (ref 0.0–1.2)
CO2: 22 mmol/L (ref 20–29)
Calcium: 10 mg/dL (ref 8.6–10.2)
Chloride: 102 mmol/L (ref 96–106)
Creatinine, Ser: 1.08 mg/dL (ref 0.76–1.27)
Globulin, Total: 2.3 g/dL (ref 1.5–4.5)
Glucose: 93 mg/dL (ref 70–99)
Potassium: 4.9 mmol/L (ref 3.5–5.2)
Sodium: 140 mmol/L (ref 134–144)
Total Protein: 7.1 g/dL (ref 6.0–8.5)
eGFR: 74 mL/min/{1.73_m2} (ref >59)

## 2022-11-18 LAB — TSH: TSH: 2.44 u[IU]/mL (ref 0.450–4.500)

## 2022-11-18 LAB — CBC
Hematocrit: 46.3 % (ref 37.5–51.0)
Hemoglobin: 15.3 g/dL (ref 13.0–17.7)
MCH: 32.4 pg (ref 26.6–33.0)
MCHC: 33 g/dL (ref 31.5–35.7)
MCV: 98 fL — ABNORMAL HIGH (ref 79–97)
Platelets: 189 10*3/uL (ref 150–450)
RBC: 4.72 x10E6/uL (ref 4.14–5.80)
RDW: 12.3 % (ref 11.6–15.4)
WBC: 5 10*3/uL (ref 3.4–10.8)

## 2022-11-18 LAB — LIPID PANEL
Chol/HDL Ratio: 2.5 ratio (ref 0.0–5.0)
Cholesterol, Total: 165 mg/dL (ref 100–199)
HDL: 66 mg/dL (ref >39)
LDL Chol Calc (NIH): 89 mg/dL (ref 0–99)
Triglycerides: 49 mg/dL (ref 0–149)
VLDL Cholesterol Cal: 10 mg/dL (ref 5–40)

## 2022-11-18 LAB — PSA: Prostate Specific Ag, Serum: 3.6 ng/mL (ref 0.0–4.0)

## 2022-11-24 ENCOUNTER — Ambulatory Visit (HOSPITAL_BASED_OUTPATIENT_CLINIC_OR_DEPARTMENT_OTHER)
Admission: RE | Admit: 2022-11-24 | Discharge: 2022-11-24 | Disposition: A | Discharge status: Home / Self Care | Payer: Medicare Other | Source: Ambulatory Visit | Attending: Cardiovascular Disease | Admitting: Cardiovascular Disease

## 2022-11-24 DIAGNOSIS — I10 Essential (primary) hypertension: Secondary | ICD-10-CM | POA: Insufficient documentation

## 2022-11-24 DIAGNOSIS — E782 Mixed hyperlipidemia: Secondary | ICD-10-CM

## 2023-01-12 ENCOUNTER — Telehealth: Payer: Self-pay | Admitting: Cardiovascular Disease

## 2023-01-12 DIAGNOSIS — E785 Hyperlipidemia, unspecified: Secondary | ICD-10-CM

## 2023-01-12 DIAGNOSIS — I10 Essential (primary) hypertension: Secondary | ICD-10-CM

## 2023-01-12 MED ORDER — NEBIVOLOL HCL 5 MG PO TABS: 5.0000 mg | ORAL_TABLET | Freq: Every day | ORAL | 3 refills | Status: DC

## 2023-01-12 NOTE — Telephone Encounter (Signed)
*  STAT* If patient is at the pharmacy, call can be transferred to refill team.   1. Which medications need to be refilled? (please list name of each medication and dose if known)  nebivolol (BYSTOLIC) 5 MG tablet   2. Which pharmacy/location (including street and city if local pharmacy) is medication to be sent to? [facility] [address] in [place] Phone # [phone]  3. Do they need a 30 day or 90 day supply? 90 day

## 2023-01-12 NOTE — Addendum Note (Signed)
Addended by: Dorris Fetch on: 01/12/2023 02:52 PM   Modules accepted: Orders

## 2023-01-12 NOTE — Telephone Encounter (Signed)
Called patient and informed him that his refill for bystolic 5 mg daily has be refilled and sent to his local requested pharmacy. Mr. Confer thanked me for calling and informed that pharmacy will call when ready for pick up and to give our office a call for any other needs. Patient verbalized understanding and all (if any) questions were answered

## 2023-01-26 ENCOUNTER — Ambulatory Visit: Payer: Medicare Other | Admitting: Cardiovascular Disease

## 2023-08-11 ENCOUNTER — Other Ambulatory Visit: Payer: Self-pay | Admitting: Cardiovascular Disease

## 2023-08-11 DIAGNOSIS — I10 Essential (primary) hypertension: Secondary | ICD-10-CM

## 2023-08-11 DIAGNOSIS — E785 Hyperlipidemia, unspecified: Secondary | ICD-10-CM

## 2023-11-15 ENCOUNTER — Ambulatory Visit: Payer: Medicare Other | Admitting: Cardiovascular Disease

## 2023-11-29 ENCOUNTER — Ambulatory Visit: Attending: Cardiovascular Disease | Admitting: Cardiovascular Disease

## 2023-11-29 ENCOUNTER — Encounter: Payer: Self-pay | Admitting: Cardiovascular Disease

## 2023-11-29 VITALS — BP 142/86 | HR 59 | Ht 71.0 in | Wt 184.6 lb

## 2023-11-29 DIAGNOSIS — E782 Mixed hyperlipidemia: Secondary | ICD-10-CM | POA: Diagnosis not present

## 2023-11-29 DIAGNOSIS — I10 Essential (primary) hypertension: Secondary | ICD-10-CM | POA: Diagnosis not present

## 2023-11-29 LAB — LIPID PANEL
Chol/HDL Ratio: 3 ratio (ref 0.0–5.0)
Cholesterol, Total: 140 mg/dL (ref 100–199)
HDL: 46 mg/dL (ref >39)
LDL Chol Calc (NIH): 79 mg/dL (ref 0–99)
Triglycerides: 74 mg/dL (ref 0–149)
VLDL Cholesterol Cal: 15 mg/dL (ref 5–40)

## 2023-11-29 LAB — HEPATIC FUNCTION PANEL
ALT: 19 IU/L (ref 0–44)
AST: 23 IU/L (ref 0–40)
Albumin: 4.2 g/dL (ref 3.8–4.8)
Alkaline Phosphatase: 115 IU/L (ref 44–121)
Bilirubin Total: 0.9 mg/dL (ref 0.0–1.2)
Bilirubin, Direct: 0.26 mg/dL (ref 0.00–0.40)
Total Protein: 6.5 g/dL (ref 6.0–8.5)

## 2023-11-29 NOTE — Assessment & Plan Note (Signed)
 History of essential hypertension her blood pressure measured today at 142/86.  He is on Bystolic.

## 2023-11-29 NOTE — Assessment & Plan Note (Signed)
 History of hyperlipidemia on statin therapy with lipid profile performed 11/17/2022 revealing total cholesterol 165, LDL 89 HDL 66, acceptable for primary prevention given his coronary calcium score of 0.

## 2023-11-29 NOTE — Patient Instructions (Addendum)
 Medication Instructions:  Your physician recommends that you continue on your current medications as directed. Please refer to the Current Medication list given to you today.  *If you need a refill on your cardiac medications before your next appointment, please call your pharmacy*  Lab Work: Your physician recommends that have labs drawn today: Lipid/liver panel  If you have labs (blood work) drawn today and your tests are completely normal, you will receive your results only by: MyChart [place] (if you have MyChart) OR A paper copy in the mail If you have any lab test that is abnormal or we need to change your treatment, we will call you to review the results.   Follow-Up: At [facility], you and your health needs are our priority.  As part of our continuing mission to provide you with exceptional heart care, our providers are all part of one team.  This team includes your primary Cardiologist (physician) and Advanced Practice Providers or APPs (Physician Assistants and Nurse Practitioners) who all work together to provide you with the care you need, when you need it.  Your next appointment:   12 month(s)  Provider:   Nanetta Batty, MD     We recommend signing up for the patient portal called "MyChart".  Sign up information is provided on this After Visit Summary.  MyChart is used to connect with patients for Virtual Visits (Telemedicine).  Patients are able to view lab/test results, encounter notes, upcoming appointments, etc.  Non-urgent messages can be sent to your provider as well.   To learn more about what you can do with MyChart, go to [email or web address].   Other Instructions       1st Floor: - Lobby - Registration  - Pharmacy  - Lab - Cafe  2nd Floor: - PV Lab - Diagnostic Testing (echo, CT, nuclear med)  3rd Floor: - Vacant  4th Floor: - TCTS (cardiothoracic surgery) - AFib Clinic - Structural Heart Clinic - Vascular Surgery  - Vascular  Ultrasound  5th Floor: - HeartCare Cardiology (general and EP) - Clinical Pharmacy for coumadin, hypertension, lipid, weight-loss medications, and med management appointments    Valet parking services will be available as well.

## 2023-11-29 NOTE — Progress Notes (Signed)
 11/29/2023 Dustin Meager Jr.   Jul 23, 1951  [id number]  Primary Physician Dan Maker., MD Primary Cardiologist: Runell Gess MD FACP, [facility], [place], [place]  HPI:  Dustin Woodard. is a 73 y.o.   mildly overweight married Caucasian male father of 3, grandfather 5 grandchildren referred by Dr. Hoy Morn to establish in our cardiovascular practice. His primary care physician was Dr. Royanne Foots in [facility] who recently retired. 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 I last saw him in the office 11/17/2022. History of cardiovascular risk factor profile is positive for hypertension and hyperlipidemia otherwise is benign. He drinks socially and does not smoke. He denies chest pain or shortness of breath. I He is an active fisherman and  hunter and has taken a trips to [place] as well as the [place] and has hunted in [place] .   Since I saw him a year ago he is done well.  He denies chest pain or shortness of breath.  He unfortunately fell in his garage back in August carrying a 46 pound decoy and injured his right knee.  He currently has a plate with 12 screws in it.  He had PT up until December and missed hunting season.  Otherwise, he was pretty active and this did denies chest pain or shortness of breath.  I did a coronary calcium score on him 11/24/2022 which was 0.  He does workout at [employer] 3 days a week.  He is scheduled to take his entire family to [place] and may, his son fishing up in [place] and a 9-day trip to [place] to go flyfishing.   Current Meds  Medication Sig   aspirin 81 MG tablet Take 81 mg by mouth daily.   nebivolol (BYSTOLIC) 5 MG tablet Take 1 tablet (5 mg total) by mouth daily.   simvastatin (ZOCOR) 40 MG tablet TAKE 1 TABLET BY MOUTH DAILY     No Known Allergies  Social History   Socioeconomic History   Marital status: Married    Spouse name: Not on file   Number of children: Not on file   Years of  education: Not on file   Highest education level: Not on file  Occupational History   Not on file  Tobacco Use   Smoking status: Never   Smokeless tobacco: Never  Substance and Sexual Activity   Alcohol use: Yes    Comment: occ wine and beer   Drug use: Not on file   Sexual activity: Not on file  Other Topics Concern   Not on file  Social History Narrative   Not on file   Social Drivers of Health   Financial Resource Strain: Low Risk  (05/14/2023)   Received from [facility]   Overall Financial Resource Strain (CARDIA)    Difficulty of Paying Living Expenses: Not very hard  Food Insecurity: Low Risk  (11/13/2023)   Received from [facility]   Hunger Vital Sign    Worried About Running Out of Food in the Last Year: Never true    Ran Out of Food in the Last Year: Never true  Transportation Needs: No Transportation Needs (11/13/2023)   [facility] from [facility]    In the past 12 months, has lack of reliable transportation kept you from medical appointments, meetings, work or from getting things needed for daily living? : No  Physical Activity: Unknown (05/14/2023)   Received from [facility]  Exercise Vital Sign    Days of Exercise per Week: Patient declined    Minutes of Exercise per Session: Not on file  Stress: Patient Declined (05/14/2023)   Received from [facility] Occupational [facility] - Occupational Stress Questionnaire    Feeling of Stress : Patient declined  Social Connections: Moderately Integrated (05/14/2023)   [facility]   Social Network    How would you rate your social network (family, work, friends)?: Adequate participation with social networks  Intimate Partner Violence: Not At Risk (05/14/2023)   Received from [facility]   HITS    Over the last 12 months how often did your partner physically hurt you?: Never    Over the last 12 months how often did your partner insult you or talk down to  you?: Never    Over the last 12 months how often did your partner threaten you with physical harm?: Never    Over the last 12 months how often did your partner scream or curse at you?: Never     Review of Systems: General: negative for chills, fever, night sweats or weight changes.  Cardiovascular: negative for chest pain, dyspnea on exertion, edema, orthopnea, palpitations, paroxysmal nocturnal dyspnea or shortness of breath Dermatological: negative for rash Respiratory: negative for cough or wheezing Urologic: negative for hematuria Abdominal: negative for nausea, vomiting, diarrhea, bright red blood per rectum, melena, or hematemesis Neurologic: negative for visual changes, syncope, or dizziness All other systems reviewed and are otherwise negative except as noted above.    Blood pressure (!) 142/86, pulse (!) 59, height 5\' 11"  (1.803 m), weight 184 lb 9.6 oz (83.7 kg), SpO2 96%.  General appearance: alert and no distress Neck: no adenopathy, no carotid bruit, no JVD, supple, symmetrical, trachea midline, and thyroid not enlarged, symmetric, no tenderness/mass/nodules Lungs: clear to auscultation bilaterally Heart: regular rate and rhythm, S1, S2 normal, no murmur, click, rub or gallop Extremities: extremities normal, atraumatic, no cyanosis or edema Pulses: 2+ and symmetric Skin: Skin color, texture, turgor normal. No rashes or lesions Neurologic: Grossly normal  EKG EKG Interpretation Date/Time:  Tuesday November 29 2023 09:02:35 EDT Ventricular Rate:  59 PR Interval:  138 QRS Duration:  86 QT Interval:  422 QTC Calculation: 417 R Axis:   57  Text Interpretation: Sinus bradycardia Abnormal QRS-T angle, consider primary T wave abnormality No previous ECGs available Confirmed by Nanetta Batty [phone]) on 11/29/2023 9:10:37 AM    ASSESSMENT AND PLAN:   Essential hypertension History of essential hypertension her blood pressure measured today at 142/86.  He is on  Bystolic.  Hyperlipidemia History of hyperlipidemia on statin therapy with lipid profile performed 11/17/2022 revealing total cholesterol 165, LDL 89 HDL 66, acceptable for primary prevention given his coronary calcium score of 0.     Runell Gess MD FACP,FACC,FAHA, [facility] 11/29/2023 9:19 AM

## 2024-01-22 ENCOUNTER — Other Ambulatory Visit: Payer: Self-pay | Admitting: Cardiovascular Disease

## 2024-01-22 DIAGNOSIS — I10 Essential (primary) hypertension: Secondary | ICD-10-CM

## 2024-01-22 DIAGNOSIS — E785 Hyperlipidemia, unspecified: Secondary | ICD-10-CM

## 2024-04-19 ENCOUNTER — Other Ambulatory Visit: Payer: Self-pay | Admitting: Cardiovascular Disease

## 2024-04-19 DIAGNOSIS — E785 Hyperlipidemia, unspecified: Secondary | ICD-10-CM

## 2024-04-19 DIAGNOSIS — I10 Essential (primary) hypertension: Secondary | ICD-10-CM
